# Patient Record
Sex: Female | Born: 1990
Health system: Southern US, Community
[De-identification: ages and names within clinical notes are randomized; demographics above are authoritative.]

## PROBLEM LIST (undated history)

## (undated) ENCOUNTER — Emergency Department (HOSPITAL_COMMUNITY): Payer: Medicaid Other

## (undated) DIAGNOSIS — O2230 Deep phlebothrombosis in pregnancy, unspecified trimester: Secondary | ICD-10-CM

## (undated) DIAGNOSIS — L709 Acne, unspecified: Secondary | ICD-10-CM

## (undated) DIAGNOSIS — O24419 Gestational diabetes mellitus in pregnancy, unspecified control: Secondary | ICD-10-CM

## (undated) HISTORY — DX: Gestational diabetes mellitus in pregnancy, unspecified control: O24.419

## (undated) HISTORY — DX: Acne, unspecified: L70.9

## (undated) HISTORY — DX: Deep phlebothrombosis in pregnancy, unspecified trimester: O22.30

## (undated) HISTORY — PX: DILATION AND CURETTAGE OF UTERUS: SHX78

---
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
## 2018-01-30 ENCOUNTER — Encounter (HOSPITAL_COMMUNITY): Payer: Self-pay | Admitting: Emergency Medicine

## 2018-01-30 ENCOUNTER — Emergency Department (HOSPITAL_COMMUNITY)
Admission: EM | Admit: 2018-01-30 | Discharge: 2018-01-30 | Disposition: A | Discharge status: Home / Self Care | Payer: Medicaid Other | Source: Home / Self Care | Attending: Emergency Medicine | Admitting: Emergency Medicine

## 2018-01-30 ENCOUNTER — Encounter (HOSPITAL_COMMUNITY): Payer: Self-pay | Admitting: Nurse Practitioner

## 2018-01-30 ENCOUNTER — Emergency Department (HOSPITAL_COMMUNITY)
Admission: EM | Admit: 2018-01-30 | Discharge: 2018-01-30 | Discharge status: Against Medical Advice | Payer: Medicaid Other | Attending: Emergency Medicine | Admitting: Emergency Medicine

## 2018-01-30 ENCOUNTER — Emergency Department (HOSPITAL_BASED_OUTPATIENT_CLINIC_OR_DEPARTMENT_OTHER): Payer: Medicaid Other

## 2018-01-30 DIAGNOSIS — Z5321 Procedure and treatment not carried out due to patient leaving prior to being seen by health care provider: Secondary | ICD-10-CM | POA: Diagnosis not present

## 2018-01-30 DIAGNOSIS — I82432 Acute embolism and thrombosis of left popliteal vein: Secondary | ICD-10-CM

## 2018-01-30 DIAGNOSIS — Z3201 Encounter for pregnancy test, result positive: Secondary | ICD-10-CM

## 2018-01-30 DIAGNOSIS — O2230 Deep phlebothrombosis in pregnancy, unspecified trimester: Secondary | ICD-10-CM

## 2018-01-30 DIAGNOSIS — I82452 Acute embolism and thrombosis of left peroneal vein: Secondary | ICD-10-CM | POA: Insufficient documentation

## 2018-01-30 DIAGNOSIS — I82442 Acute embolism and thrombosis of left tibial vein: Secondary | ICD-10-CM

## 2018-01-30 DIAGNOSIS — I82492 Acute embolism and thrombosis of other specified deep vein of left lower extremity: Secondary | ICD-10-CM | POA: Insufficient documentation

## 2018-01-30 DIAGNOSIS — Z3A08 8 weeks gestation of pregnancy: Secondary | ICD-10-CM | POA: Insufficient documentation

## 2018-01-30 DIAGNOSIS — M7989 Other specified soft tissue disorders: Secondary | ICD-10-CM | POA: Diagnosis not present

## 2018-01-30 DIAGNOSIS — R609 Edema, unspecified: Secondary | ICD-10-CM | POA: Diagnosis not present

## 2018-01-30 DIAGNOSIS — R52 Pain, unspecified: Secondary | ICD-10-CM

## 2018-01-30 DIAGNOSIS — I82409 Acute embolism and thrombosis of unspecified deep veins of unspecified lower extremity: Secondary | ICD-10-CM | POA: Diagnosis not present

## 2018-01-30 DIAGNOSIS — O2231 Deep phlebothrombosis in pregnancy, first trimester: Secondary | ICD-10-CM | POA: Diagnosis not present

## 2018-01-30 DIAGNOSIS — M79605 Pain in left leg: Secondary | ICD-10-CM | POA: Insufficient documentation

## 2018-01-30 LAB — BASIC METABOLIC PANEL
Anion gap: 8 (ref 5–15)
BUN: 13 mg/dL (ref 6–20)
CO2: 23 mmol/L (ref 22–32)
Calcium: 9 mg/dL (ref 8.9–10.3)
Chloride: 103 mmol/L (ref 98–111)
Creatinine, Ser: 0.6 mg/dL (ref 0.44–1.00)
GFR calc non Af Amer: >60 mL/min (ref >60)
Glucose, Bld: 85 mg/dL (ref 70–99)
Potassium: 3.9 mmol/L (ref 3.5–5.1)
Sodium: 134 mmol/L — ABNORMAL LOW (ref 135–145)

## 2018-01-30 LAB — URINALYSIS, ROUTINE W REFLEX MICROSCOPIC
BILIRUBIN URINE: NEGATIVE
Glucose, UA: NEGATIVE mg/dL
Hgb urine dipstick: NEGATIVE
Ketones, ur: NEGATIVE mg/dL
Nitrite: NEGATIVE
Protein, ur: NEGATIVE mg/dL
Specific Gravity, Urine: 1.03 (ref 1.005–1.030)
pH: 6 (ref 5.0–8.0)

## 2018-01-30 LAB — CBC WITH DIFFERENTIAL/PLATELET
Abs Immature Granulocytes: 0.05 10*3/uL (ref 0.00–0.07)
Basophils Absolute: 0 10*3/uL (ref 0.0–0.1)
Basophils Relative: 0 %
Eosinophils Absolute: 0.1 10*3/uL (ref 0.0–0.5)
Eosinophils Relative: 1 %
HEMATOCRIT: 36.2 % (ref 36.0–46.0)
Hemoglobin: 11.5 g/dL — ABNORMAL LOW (ref 12.0–15.0)
Immature Granulocytes: 1 %
Lymphocytes Relative: 8 %
Lymphs Abs: 0.6 10*3/uL — ABNORMAL LOW (ref 0.7–4.0)
MCH: 27.4 pg (ref 26.0–34.0)
MCHC: 31.8 g/dL (ref 30.0–36.0)
MCV: 86.2 fL (ref 80.0–100.0)
Monocytes Absolute: 0.7 10*3/uL (ref 0.1–1.0)
Monocytes Relative: 10 %
Neutro Abs: 6.1 10*3/uL (ref 1.7–7.7)
Neutrophils Relative %: 80 %
Platelets: 132 10*3/uL — ABNORMAL LOW (ref 150–400)
RBC: 4.2 MIL/uL (ref 3.87–5.11)
RDW: 13.6 % (ref 11.5–15.5)
WBC: 7.5 10*3/uL (ref 4.0–10.5)
nRBC: 0 % (ref 0.0–0.2)

## 2018-01-30 LAB — HEPATIC FUNCTION PANEL
ALK PHOS: 47 U/L (ref 38–126)
ALT: 27 U/L (ref 0–44)
AST: 27 U/L (ref 15–41)
Albumin: 3.6 g/dL (ref 3.5–5.0)
Bilirubin, Direct: <0.1 mg/dL (ref 0.0–0.2)
Total Bilirubin: 0.5 mg/dL (ref 0.3–1.2)
Total Protein: 7.8 g/dL (ref 6.5–8.1)

## 2018-01-30 LAB — PREGNANCY, URINE: Preg Test, Ur: POSITIVE — AB

## 2018-01-30 MED ORDER — ENOXAPARIN (LOVENOX) PATIENT EDUCATION KIT
PACK | Freq: Once | Status: AC
  Administered 2018-01-30: 16:00:00
  Filled 2018-01-30: qty 1

## 2018-01-30 MED ORDER — ACETAMINOPHEN 325 MG PO TABS
650.0000 mg | ORAL_TABLET | Freq: Once | ORAL | Status: AC
  Administered 2018-01-30: 650 mg via ORAL
  Filled 2018-01-30: qty 2

## 2018-01-30 MED ORDER — ENOXAPARIN SODIUM 80 MG/0.8ML ~~LOC~~ SOLN
1.0000 mg/kg | Freq: Once | SUBCUTANEOUS | Status: AC
  Administered 2018-01-30: 75 mg via SUBCUTANEOUS
  Filled 2018-01-30: qty 0.75

## 2018-01-30 MED ORDER — ENOXAPARIN SODIUM 40 MG/0.4ML ~~LOC~~ SOLN: 80.0000 mg | SUBCUTANEOUS | 0 refills | Status: DC

## 2018-01-30 NOTE — ED Notes (Signed)
Family at bedside. 

## 2018-01-30 NOTE — ED Provider Notes (Signed)
[facility] DEPT Provider Note   CSN: [id number] Arrival date & time: 01/30/18  1039     History   Chief Complaint Chief Complaint  Patient presents with  . Fever  . Leg Pain    HPI Marie Wade is a 27 y.o. female.  HPI  Marie Wade is a 27 y.o. female, with a history of G6P5A3, presenting to the ED with left lower leg pain beginning around December 4 and worsening thereafter.  Pain began after road trip to [place]. Pain is primarily in the left calf, but extends into the left ankle and behind the left knee.  Pain is described as sharp/tightness, currently 5/10.  Subjective swelling in the left calf. LMP Oct 17. Has had positive pregnancy tests at home. Is fairly new to the area and has not yet established with OBGYN or PCP locally. Patient also complains of subjective fever beginning yesterday along with nonproductive cough.  Triage note indicates patient has a history of blood clot in pregnancy, however, she clarifies she has no personal history of blood clots, but first-degree relatives do have history of blood clot in pregnancy.    Denies shortness of breath, chest pain, abdominal pain, urinary symptoms, vaginal bleeding, N/V/D, abnormal vaginal discharge, back/flank pain, syncope, hemoptysis, or any other complaints.    History reviewed. No pertinent past medical history.  There are no active problems to display for this patient.   History reviewed. No pertinent surgical history.   OB History    Gravida  1   Para      Term      Preterm      AB      Living        SAB      TAB      Ectopic      Multiple      Live Births               Home Medications    Prior to Admission medications   Medication Sig Start Date End Date Taking? Authorizing Provider  enoxaparin (LOVENOX) 40 MG/0.4ML injection Inject 0.8 mLs (80 mg total) into the skin daily for 10 days. 01/30/18 02/09/18  Lorayne Bender, PA-C    Family  History No family history on file.  Social History Social History   Tobacco Use  . Smoking status: Never Smoker  . Smokeless tobacco: Never Used  Substance Use Topics  . Alcohol use: Not on file  . Drug use: Not on file     Allergies   Watermelon [citrullus vulgaris]   Review of Systems Review of Systems  Constitutional: Positive for fever.  HENT: Negative for congestion.   Respiratory: Positive for cough. Negative for shortness of breath.   Cardiovascular: Negative for chest pain and palpitations.  Gastrointestinal: Negative for abdominal pain, diarrhea, nausea and vomiting.  Genitourinary: Negative for dysuria, flank pain, hematuria, vaginal bleeding and vaginal discharge.  Musculoskeletal: Negative for back pain and neck pain.  Skin: Negative for rash.  Neurological: Negative for weakness and headaches.  All other systems reviewed and are negative.    Physical Exam Updated Vital Signs BP 107/70 (BP Location: Left Arm)   Pulse 89   Temp 100.2 F (37.9 C) (Oral)   Resp 16   Ht '5\' 8"'  (1.727 m)   Wt 73.9 kg   LMP 11/18/2017   SpO2 99%   BMI 24.78 kg/m   Physical Exam Vitals signs and nursing note reviewed.  Constitutional:  General: She is not in acute distress.    Appearance: She is well-developed. She is not diaphoretic.  HENT:     Head: Normocephalic and atraumatic.  Eyes:     Conjunctiva/sclera: Conjunctivae normal.  Neck:     Musculoskeletal: Neck supple.  Cardiovascular:     Rate and Rhythm: Normal rate and regular rhythm.     Pulses:          Posterior tibial pulses are 2+ on the right side and 2+ on the left side.     Heart sounds: Normal heart sounds.  Pulmonary:     Effort: Pulmonary effort is normal. No respiratory distress.     Breath sounds: Normal breath sounds.  Abdominal:     Palpations: Abdomen is soft.     Tenderness: There is no abdominal tenderness. There is no guarding.  Musculoskeletal:     Comments: Patient is quite  tender through the left calf, but tenderness extends to the posterior left knee and into the left ankle.  Questionable increased warmth.  No noted color change or swelling. Range of motion intact in the left ankle and knee.  Lymphadenopathy:     Cervical: No cervical adenopathy.  Skin:    General: Skin is warm and dry.  Neurological:     Mental Status: She is alert.     Comments: Sensation grossly intact to light touch in the lower extremities bilaterally. No saddle anesthesias. Strength 5/5 with flexion and extension at the bilateral hips, knees, and ankles. Ambulatory without assistance. Coordination intact with heel to shin testing.  Psychiatric:        Behavior: Behavior normal.      ED Treatments / Results  Labs (all labs ordered are listed, but only abnormal results are displayed) Labs Reviewed  URINALYSIS, ROUTINE W REFLEX MICROSCOPIC - Abnormal; Notable for the following components:      Result Value   APPearance CLOUDY (*)    Leukocytes, UA SMALL (*)    Bacteria, UA RARE (*)    All other components within normal limits  BASIC METABOLIC PANEL - Abnormal; Notable for the following components:   Sodium 134 (*)    All other components within normal limits  CBC WITH DIFFERENTIAL/PLATELET - Abnormal; Notable for the following components:   Hemoglobin 11.5 (*)    Platelets 132 (*)    Lymphs Abs 0.6 (*)    All other components within normal limits  PREGNANCY, URINE - Abnormal; Notable for the following components:   Preg Test, Ur POSITIVE (*)    All other components within normal limits  HEPATIC FUNCTION PANEL    EKG None  Radiology Vas [place] Lower Extremity Venous (dvt) (mc And Wl 7a-7p)  Result Date: 01/30/2018  Lower Venous Study Indications: Pain, Swelling, and Edema. Other Indications: Current with 8 weeks pregnancy. Limitations: Body habitus and pain. Performing Technologist: Lorina Rabon  Examination Guidelines: A complete evaluation includes B-mode imaging,  spectral Doppler, color Doppler, and power Doppler as needed of all accessible portions of each vessel. Bilateral testing is considered an integral part of a complete examination. Limited examinations for reoccurring indications may be performed as noted.  Right Venous Findings: +---+---------------+---------+-----------+----------+-------+    CompressibilityPhasicitySpontaneityPropertiesSummary +---+---------------+---------+-----------+----------+-------+ CFVFull           Yes      Yes                          +---+---------------+---------+-----------+----------+-------+  Left Venous Findings: +---------+---------------+---------+-----------+----------+-------+  CompressibilityPhasicitySpontaneityPropertiesSummary +---------+---------------+---------+-----------+----------+-------+ CFV      Full           Yes      Yes                          +---------+---------------+---------+-----------+----------+-------+ SFJ      Full                                                 +---------+---------------+---------+-----------+----------+-------+ FV Prox  Full                                                 +---------+---------------+---------+-----------+----------+-------+ FV Mid   Full                                                 +---------+---------------+---------+-----------+----------+-------+ FV DistalFull                                                 +---------+---------------+---------+-----------+----------+-------+ PFV      Full                                                 +---------+---------------+---------+-----------+----------+-------+ POP      None           No       No                   Acute   +---------+---------------+---------+-----------+----------+-------+ PTV      None                                         Acute   +---------+---------------+---------+-----------+----------+-------+ PERO     None                                          Acute   +---------+---------------+---------+-----------+----------+-------+ Soleal   None                                         Acute   +---------+---------------+---------+-----------+----------+-------+ Gastroc  None                                         Acute   +---------+---------------+---------+-----------+----------+-------+    Summary: Right: No evidence of common femoral vein obstruction. Left: Findings consistent with acute deep vein thrombosis involving the left popliteal vein, left posterior tibial vein, left peroneal vein, left soleal  vein, and left gastrocnemius vein. No cystic structure found in the popliteal fossa.  *See table(s) above for measurements and observations.    Preliminary     Procedures Procedures (including critical care time)  Medications Ordered in ED Medications  acetaminophen (TYLENOL) tablet 650 mg (650 mg Oral Given 01/30/18 1359)  enoxaparin (LOVENOX) injection 75 mg (75 mg Subcutaneous Given 01/30/18 1556)  enoxaparin (LOVENOX) patient education kit ( Does not apply Given 01/30/18 1600)     Initial Impression / Assessment and Plan / ED Course  I have reviewed the triage vital signs and the nursing notes.  Pertinent labs & imaging results that were available during my care of the patient were reviewed by me and considered in my medical decision making (see chart for details).  Clinical Course as of Jan 31 1720  Sun Jan 30, 2018  1447 Spoke with Chilton Greathouse, answering phone for on call OBGYN who is in the middle of a c-section. She will have the physician call me back.   [SJ]  1500 Spoke with Larkin Ina, pharmacist. Confirms Lovenox is therapy of choice for pregnant patient with DVT.  He will place the necessary orders for the education kit and the first dose of Lovenox here in the ED.  RN to perform Lovenox administration counseling.   [SJ]  1557 Spoke with Dr. Rosana Hoes, faculty OB/GYN on  call. Agrees with plan for Lovenox as well as Lovenox dose of 1 mg/kg.  States she will contact to the scheduling personnel at her office and patient should expect a call tomorrow to set up an appointment to be seen early this week.   [SJ]    Clinical Course User Index [SJ] Shainna Faux C, PA-C    Patient presents with left lower extremity pain.  Evidence of DVT on duplex ultrasound.  Suspicion for PE is quite low given the patient's lack of chest related symptoms.  Patient does have cough and subjective fever, however, she has no complaint of chest pain or shortness of breath, no abnormal lung sounds, no increased work of breathing or tachypnea.  I also suspect if the patient has developed pneumonia with less than 24 hours of symptoms the likelihood that it would show up on chest x-ray today is suspected to be low.  Lovenox was initiated.  Education was provided at the bedside by the RN.  I confirmed with the patient and her family at the bedside that they understand the importance of this medication as well as its administration. Dosing of 1 mg/kg (75 mg) was administered here in the ED.  Due to available outpatient formulations, dosing of 80 mg was utilized for ease of administration.   Patient will follow-up with OB/GYN this week.  Return precautions discussed.  Patient voices understanding of these instructions, accepts the plan, and is comfortable with discharge.  Findings and plan of care discussed with Gareth Morgan, MD. Dr. Billy Fischer personally evaluated and examined this patient.   Vitals:   01/30/18 1054 01/30/18 1055 01/30/18 1253 01/30/18 1704  BP: 114/80  107/70 108/66  Pulse: (!) 107  89 96  Resp: '17  16 16  ' Temp: 100.2 F (37.9 C)   99.2 F (37.3 C)  TempSrc: Oral   Oral  SpO2: 99%  99% 99%  Weight:  73.9 kg    Height:  '5\' 8"'  (1.727 m)       Final Clinical Impressions(s) / ED Diagnoses   Final diagnoses:  DVT (deep vein thrombosis) in pregnancy  ED  Discharge Orders         Ordered    enoxaparin (LOVENOX) 40 MG/0.4ML injection  Every 24 hours     01/30/18 1627           Lorayne Bender, PA-C 01/30/18 1722    Gareth Morgan, MD 01/31/18 [phone]

## 2018-01-30 NOTE — Discharge Instructions (Signed)
There was evidence of a blood clot in the lower left leg. We have initiated a blood thinner to prevent further clotting.  Pain management can be performed with Tylenol.  Follow-up with OB/GYN.  Their office should call you tomorrow.  Return to the ED for chest pain, shortness of breath, dizziness, passing out, bleeding, or any other major concerns.

## 2018-01-30 NOTE — ED Triage Notes (Signed)
Pt is c/o left leg/calf pain that has been ongoing for about 3 weeks but worsened tonight. States she has noticed swelling, denies trauma and is concerned that blood clot run in her family.

## 2018-01-30 NOTE — ED Notes (Signed)
Pt reports L leg pain x 25 days ago, denies injury.  She is ambulatory without difficulty.  She also endorses sore throat x 2 days, with fever which started yesterday.  She was here last night for same but the wait was too long so she LWBS.

## 2018-01-30 NOTE — ED Notes (Addendum)
Pt instructed on lovenox administration.  Pt and boyfriend verbalized understanding.

## 2018-01-30 NOTE — Progress Notes (Addendum)
Left lower extremity venous duplex exam completed.  Positive Findings consistent with acute deep vein thrombosis involving the left popliteal vein, left posterior tibial vein, left peroneal vein, left soleal vein, and left gastrocnemius vein.  Result paged ordering physician through ED secretory.  More details please see preliminary notes on CV PROC under chart review. Memory Heinrichs H Makiah Foye(RDMS RVT) 01/30/18 3:33 PM

## 2018-01-30 NOTE — ED Triage Notes (Signed)
Per [facility] pt from home c/o left leg pain for 6 weeks and fever for 2 days.  reports blood clot hx during pregnancies. Pt hasnt taken anything for fever or pains. Pt is [redacted] weeks pregnant.  Vitals 132/60, 112/60, 100HR, 16R, 98% CBG 98 temp 101.5.

## 2018-01-30 NOTE — ED Notes (Signed)
Pt stated she was going to go home to get checked by her primary physician.

## 2018-01-31 ENCOUNTER — Telehealth: Payer: Self-pay | Admitting: Obstetrics & Gynecology

## 2018-01-31 NOTE — Telephone Encounter (Signed)
Called patient and informed of upcoming appointment. °

## 2018-02-11 ENCOUNTER — Other Ambulatory Visit (HOSPITAL_COMMUNITY)
Admission: RE | Admit: 2018-02-11 | Discharge: 2018-02-11 | Disposition: A | Discharge status: Home / Self Care | Payer: Medicaid Other | Source: Ambulatory Visit | Attending: Obstetrics & Gynecology | Admitting: Obstetrics & Gynecology

## 2018-02-11 ENCOUNTER — Ambulatory Visit (INDEPENDENT_AMBULATORY_CARE_PROVIDER_SITE_OTHER): Payer: Medicaid Other | Admitting: Obstetrics & Gynecology

## 2018-02-11 ENCOUNTER — Encounter: Payer: Self-pay | Admitting: Obstetrics & Gynecology

## 2018-02-11 DIAGNOSIS — O2231 Deep phlebothrombosis in pregnancy, first trimester: Secondary | ICD-10-CM | POA: Diagnosis not present

## 2018-02-11 DIAGNOSIS — Z8632 Personal history of gestational diabetes: Secondary | ICD-10-CM

## 2018-02-11 DIAGNOSIS — O2230 Deep phlebothrombosis in pregnancy, unspecified trimester: Secondary | ICD-10-CM | POA: Insufficient documentation

## 2018-02-11 DIAGNOSIS — O0990 Supervision of high risk pregnancy, unspecified, unspecified trimester: Secondary | ICD-10-CM | POA: Insufficient documentation

## 2018-02-11 DIAGNOSIS — O09891 Supervision of other high risk pregnancies, first trimester: Secondary | ICD-10-CM | POA: Diagnosis not present

## 2018-02-11 DIAGNOSIS — O0991 Supervision of high risk pregnancy, unspecified, first trimester: Secondary | ICD-10-CM

## 2018-02-11 HISTORY — DX: Personal history of gestational diabetes: Z86.32

## 2018-02-11 MED ORDER — PRENATAL PLUS 27-1 MG PO TABS: 1.0000 | ORAL_TABLET | Freq: Every day | ORAL | 0 refills | Status: DC

## 2018-02-11 NOTE — Progress Notes (Signed)
  Subjective:    Marie Wade is being seen today for her first obstetrical visit.  This is not a planned pregnancy. She is at [redacted]w[redacted]d gestation. Her obstetrical history is significant for left lower extremity DVT and GDM with her first pregnancy. Relationship with FOB: significant other, living together. Patient does intend to breast feed. Pregnancy history fully reviewed.  Patient reports no complaints.  Review of Systems:   Review of Systems  Objective:     BP 115/74   Pulse 82   Wt 161 lb (73 kg)   LMP 11/18/2017 (Exact Date)   BMI 24.48 kg/m  Physical Exam  Exam Breathing, conversing, and ambulating normally Well nourished, well hydrated Black female, no apparent distress Heart- rrr Lungs- CTAB Abd- benign   Assessment:    Pregnancy: [id number] Patient Active Problem List   Diagnosis Date Noted  . DVT (deep vein thrombosis) in pregnancy 02/11/2018  . Supervision of high risk pregnancy, antepartum 02/11/2018  . History of gestational diabetes 02/11/2018       Plan:     Initial labs drawn. Prenatal vitamins. Problem list reviewed and updated. NIPS ordered Role of ultrasound in pregnancy discussed; fetal survey: ordered Follow up in 4 weeks. Continue lovenox BID HBA1C    Marie Wade 02/11/2018

## 2018-02-13 LAB — URINE CULTURE, OB REFLEX: Organism ID, Bacteria: NO GROWTH

## 2018-02-13 LAB — CULTURE, OB URINE

## 2018-02-14 ENCOUNTER — Encounter: Payer: Self-pay | Admitting: *Deleted

## 2018-02-15 LAB — CYTOLOGY - PAP
Chlamydia: NEGATIVE
Diagnosis: NEGATIVE
Neisseria Gonorrhea: NEGATIVE

## 2018-02-16 ENCOUNTER — Telehealth: Payer: Self-pay

## 2018-02-16 ENCOUNTER — Encounter: Payer: Medicaid Other | Admitting: Obstetrics

## 2018-02-16 MED ORDER — ENOXAPARIN SODIUM 40 MG/0.4ML ~~LOC~~ SOLN: 40.0000 mg | Freq: Two times a day (BID) | SUBCUTANEOUS | 4 refills | Status: DC

## 2018-02-16 NOTE — Telephone Encounter (Signed)
Pt called stating that she needs a refill on her blood thinner.  LM for pt that we have refilled the medication that she has requested to please go to her [facility] pharmacy on [address] to pick up.  MyChart message sent as well.

## 2018-02-24 LAB — INHERITEST(R) CF/SMA PANEL

## 2018-02-24 LAB — OBSTETRIC PANEL, INCLUDING HIV
Antibody Screen: NEGATIVE
BASOS ABS: 0 10*3/uL (ref 0.0–0.2)
Basos: 0 %
EOS (ABSOLUTE): 0.1 10*3/uL (ref 0.0–0.4)
Eos: 1 %
HEP B S AG: NEGATIVE
HIV Screen 4th Generation wRfx: NONREACTIVE
Hematocrit: 32.8 % — ABNORMAL LOW (ref 34.0–46.6)
Hemoglobin: 10.9 g/dL — ABNORMAL LOW (ref 11.1–15.9)
Immature Grans (Abs): 0.1 10*3/uL (ref 0.0–0.1)
Immature Granulocytes: 1 %
Lymphocytes Absolute: 1.7 10*3/uL (ref 0.7–3.1)
Lymphs: 23 %
MCH: 27.4 pg (ref 26.6–33.0)
MCHC: 33.2 g/dL (ref 31.5–35.7)
MCV: 82 fL (ref 79–97)
MONOCYTES: 11 %
Monocytes Absolute: 0.8 10*3/uL (ref 0.1–0.9)
Neutrophils Absolute: 4.8 10*3/uL (ref 1.4–7.0)
Neutrophils: 64 %
Platelets: 272 10*3/uL (ref 150–450)
RBC: 3.98 x10E6/uL (ref 3.77–5.28)
RDW: 13.7 % (ref 11.7–15.4)
RPR: NONREACTIVE
Rh Factor: POSITIVE
Rubella Antibodies, IGG: 2.19 index (ref >0.99)
WBC: 7.4 10*3/uL (ref 3.4–10.8)

## 2018-02-24 LAB — HEMOGLOBINOPATHY EVALUATION
Ferritin: 82 ng/mL (ref 15–150)
Hgb A2 Quant: 2.3 % (ref 1.8–3.2)
Hgb A: 97.7 % (ref 96.4–98.8)
Hgb C: 0 %
Hgb F Quant: 0 % (ref 0.0–2.0)
Hgb S: 0 %
Hgb Solubility: NEGATIVE
Hgb Variant: 0 %

## 2018-02-24 LAB — HEMOGLOBIN A1C
Est. average glucose Bld gHb Est-mCnc: 103 mg/dL
Hgb A1c MFr Bld: 5.2 % (ref 4.8–5.6)

## 2018-03-07 ENCOUNTER — Encounter: Payer: Self-pay | Admitting: *Deleted

## 2018-03-11 ENCOUNTER — Ambulatory Visit (INDEPENDENT_AMBULATORY_CARE_PROVIDER_SITE_OTHER): Payer: Medicaid Other | Admitting: Obstetrics & Gynecology

## 2018-03-11 VITALS — BP 117/70 | HR 83 | Wt 167.5 lb

## 2018-03-11 DIAGNOSIS — O2230 Deep phlebothrombosis in pregnancy, unspecified trimester: Secondary | ICD-10-CM

## 2018-03-11 DIAGNOSIS — O0990 Supervision of high risk pregnancy, unspecified, unspecified trimester: Secondary | ICD-10-CM | POA: Diagnosis not present

## 2018-03-11 DIAGNOSIS — Z8632 Personal history of gestational diabetes: Secondary | ICD-10-CM

## 2018-03-11 DIAGNOSIS — O0992 Supervision of high risk pregnancy, unspecified, second trimester: Secondary | ICD-10-CM

## 2018-03-11 DIAGNOSIS — O2232 Deep phlebothrombosis in pregnancy, second trimester: Secondary | ICD-10-CM

## 2018-03-11 DIAGNOSIS — O99019 Anemia complicating pregnancy, unspecified trimester: Secondary | ICD-10-CM

## 2018-03-11 DIAGNOSIS — O99012 Anemia complicating pregnancy, second trimester: Secondary | ICD-10-CM

## 2018-03-11 HISTORY — DX: Anemia complicating pregnancy, unspecified trimester: O99.019

## 2018-03-11 MED ORDER — FERROUS SULFATE 325 (65 FE) MG PO TABS: 325.0000 mg | ORAL_TABLET | Freq: Every day | ORAL | 3 refills | Status: DC

## 2018-03-11 MED ORDER — IBUPROFEN 800 MG PO TABS: 800.0000 mg | ORAL_TABLET | Freq: Three times a day (TID) | ORAL | 1 refills | Status: DC | PRN

## 2018-03-11 NOTE — Progress Notes (Signed)
   PRENATAL VISIT NOTE  Subjective:  Marie Wade is a 28 y.o. [id number] at [redacted]w[redacted]d being seen today for ongoing prenatal care.  She is currently monitored for the following issues for this high-risk pregnancy and has DVT (deep vein thrombosis) in pregnancy; Supervision of high risk pregnancy, antepartum; History of gestational diabetes; and Anemia in pregnancy on their problem list.  Patient reports headache.  Contractions: Not present. Vag. Bleeding: None.  Movement: Absent. Denies leaking of fluid.   The following portions of the patient's history were reviewed and updated as appropriate: allergies, current medications, past family history, past medical history, past social history, past surgical history and problem list. Problem list updated.  Objective:   Vitals:   03/11/18 0935  BP: 117/70  Pulse: 83  Weight: 167 lb 8 oz (76 kg)    Fetal Status: Fetal Heart Rate (bpm): 154   Movement: Absent     General:  Alert, oriented and cooperative. Patient is in no acute distress.  Skin: Skin is warm and dry. No rash noted.   Cardiovascular: Normal heart rate noted  Respiratory: Normal respiratory effort, no problems with respiration noted  Abdomen: Soft, gravid, appropriate for gestational age.  Pain/Pressure: Absent     Pelvic: Cervical exam deferred        Extremities: Normal range of motion.  Edema: Trace  Mental Status: Normal mood and affect. Normal behavior. Normal judgment and thought content.   Assessment and Plan:  Pregnancy: [id number] at [redacted]w[redacted]d  1. History of gestational diabetes - normal HBA1C  2. Supervision of high risk pregnancy, antepartum - MSAFP today  3. DVT (deep vein thrombosis) in pregnancy - on Lovenox  4. Anemia during pregnancy in second trimester - iron daily  5. Headaches- I prescribed IBU 800mg  q 8 hours Rec stop IBU at 28 weeks  Preterm labor symptoms and general obstetric precautions including but not limited to vaginal bleeding, contractions,  leaking of fluid and fetal movement were reviewed in detail with the patient. Please refer to After Visit Summary for other counseling recommendations.  No follow-ups on file.  No future appointments.  Allie Bossier, MD

## 2018-03-11 NOTE — Progress Notes (Signed)
Headaches 650 mg tylenol only one with no relief.  Lumps with lovanox

## 2018-03-13 LAB — AFP, SERUM, OPEN SPINA BIFIDA
AFP MOM: 0.81
AFP Value: 28 ng/mL
Gest. Age on Collection Date: 16.1 weeks
MATERNAL AGE AT EDD: 28.1 a
OSBR Risk 1 IN: 10000
Test Results:: NEGATIVE
Weight: 167 [lb_av]

## 2018-03-14 ENCOUNTER — Other Ambulatory Visit: Payer: Self-pay

## 2018-03-14 DIAGNOSIS — O0990 Supervision of high risk pregnancy, unspecified, unspecified trimester: Secondary | ICD-10-CM

## 2018-03-14 MED ORDER — PRENATAL PLUS 27-1 MG PO TABS: 1.0000 | ORAL_TABLET | Freq: Every day | ORAL | 11 refills | Status: DC

## 2018-04-06 ENCOUNTER — Ambulatory Visit (HOSPITAL_COMMUNITY)
Admission: RE | Admit: 2018-04-06 | Discharge: 2018-04-06 | Disposition: A | Discharge status: Home / Self Care | Payer: Medicaid Other | Source: Ambulatory Visit | Attending: Obstetrics & Gynecology | Admitting: Obstetrics & Gynecology

## 2018-04-06 ENCOUNTER — Other Ambulatory Visit (HOSPITAL_COMMUNITY): Payer: Self-pay | Admitting: *Deleted

## 2018-04-06 ENCOUNTER — Ambulatory Visit (HOSPITAL_COMMUNITY): Payer: Medicaid Other | Admitting: *Deleted

## 2018-04-06 ENCOUNTER — Encounter (HOSPITAL_COMMUNITY): Payer: Self-pay

## 2018-04-06 VITALS — BP 119/71 | HR 73 | Wt 173.6 lb

## 2018-04-06 DIAGNOSIS — O2232 Deep phlebothrombosis in pregnancy, second trimester: Secondary | ICD-10-CM | POA: Diagnosis not present

## 2018-04-06 DIAGNOSIS — Z3A19 19 weeks gestation of pregnancy: Secondary | ICD-10-CM

## 2018-04-06 DIAGNOSIS — Z362 Encounter for other antenatal screening follow-up: Secondary | ICD-10-CM

## 2018-04-06 DIAGNOSIS — O0990 Supervision of high risk pregnancy, unspecified, unspecified trimester: Secondary | ICD-10-CM

## 2018-04-06 DIAGNOSIS — O09292 Supervision of pregnancy with other poor reproductive or obstetric history, second trimester: Secondary | ICD-10-CM

## 2018-04-06 DIAGNOSIS — O2230 Deep phlebothrombosis in pregnancy, unspecified trimester: Secondary | ICD-10-CM | POA: Insufficient documentation

## 2018-04-06 DIAGNOSIS — Z8632 Personal history of gestational diabetes: Secondary | ICD-10-CM | POA: Diagnosis not present

## 2018-04-06 DIAGNOSIS — Z363 Encounter for antenatal screening for malformations: Secondary | ICD-10-CM | POA: Diagnosis not present

## 2018-04-08 ENCOUNTER — Ambulatory Visit (INDEPENDENT_AMBULATORY_CARE_PROVIDER_SITE_OTHER): Payer: Medicaid Other | Admitting: Obstetrics & Gynecology

## 2018-04-08 VITALS — BP 106/71 | HR 81 | Wt 174.0 lb

## 2018-04-08 DIAGNOSIS — O0990 Supervision of high risk pregnancy, unspecified, unspecified trimester: Secondary | ICD-10-CM

## 2018-04-08 DIAGNOSIS — O2230 Deep phlebothrombosis in pregnancy, unspecified trimester: Secondary | ICD-10-CM

## 2018-04-08 DIAGNOSIS — Z8632 Personal history of gestational diabetes: Secondary | ICD-10-CM

## 2018-04-08 DIAGNOSIS — Z3A20 20 weeks gestation of pregnancy: Secondary | ICD-10-CM

## 2018-04-08 DIAGNOSIS — O99012 Anemia complicating pregnancy, second trimester: Secondary | ICD-10-CM

## 2018-04-10 LAB — AFP, SERUM, OPEN SPINA BIFIDA
AFP MoM: 0.71
AFP Value: 40.8 ng/mL
Gest. Age on Collection Date: 20.1 weeks
MATERNAL AGE AT EDD: 28.1 a
OSBR Risk 1 IN: 10000
Test Results:: NEGATIVE
Weight: 174 [lb_av]

## 2018-04-26 ENCOUNTER — Encounter: Payer: Self-pay | Admitting: *Deleted

## 2018-05-02 ENCOUNTER — Telehealth: Payer: Self-pay | Admitting: Family Medicine

## 2018-05-02 NOTE — Telephone Encounter (Signed)
Called and spoke to patient letting her know that her appt will be postponed due to COVID 19, patient will be scheduled at [redacted] weeks along  With Labs, patient understood

## 2018-05-04 ENCOUNTER — Ambulatory Visit (HOSPITAL_COMMUNITY): Payer: Medicaid Other

## 2018-05-06 ENCOUNTER — Encounter: Payer: Medicaid Other | Admitting: Obstetrics & Gynecology

## 2018-06-01 ENCOUNTER — Telehealth: Payer: Self-pay | Admitting: Obstetrics and Gynecology

## 2018-06-01 NOTE — Telephone Encounter (Signed)
Called the patient to inform of the upcoming appointment. Left a detailed voicemail. °

## 2018-06-02 ENCOUNTER — Other Ambulatory Visit: Payer: Medicaid Other

## 2018-06-02 ENCOUNTER — Other Ambulatory Visit: Payer: Self-pay

## 2018-06-02 ENCOUNTER — Ambulatory Visit (INDEPENDENT_AMBULATORY_CARE_PROVIDER_SITE_OTHER): Payer: Medicaid Other | Admitting: Obstetrics & Gynecology

## 2018-06-02 ENCOUNTER — Encounter: Payer: Medicaid Other | Admitting: Obstetrics & Gynecology

## 2018-06-02 ENCOUNTER — Other Ambulatory Visit: Payer: Self-pay | Admitting: General Practice

## 2018-06-02 VITALS — BP 108/68 | HR 77 | Temp 98.2°F | Wt 183.8 lb

## 2018-06-02 DIAGNOSIS — O0993 Supervision of high risk pregnancy, unspecified, third trimester: Secondary | ICD-10-CM | POA: Diagnosis not present

## 2018-06-02 DIAGNOSIS — O0990 Supervision of high risk pregnancy, unspecified, unspecified trimester: Secondary | ICD-10-CM | POA: Diagnosis not present

## 2018-06-02 DIAGNOSIS — Z3A28 28 weeks gestation of pregnancy: Secondary | ICD-10-CM | POA: Diagnosis not present

## 2018-06-02 DIAGNOSIS — Z23 Encounter for immunization: Secondary | ICD-10-CM

## 2018-06-02 NOTE — Progress Notes (Signed)
Pt registered for BRx but never rcvd BP Cuff, will give her one & educate on use.

## 2018-06-02 NOTE — Progress Notes (Signed)
   PRENATAL VISIT NOTE  Subjective:  Marie Wade is a 28 y.o. [id number] at [redacted]w[redacted]d being seen today for ongoing prenatal care.  She is currently monitored for the following issues for this high-risk pregnancy and has DVT (deep vein thrombosis) in pregnancy; Supervision of high risk pregnancy, antepartum; History of gestational diabetes; and Anemia in pregnancy on their problem list.  Patient reports no complaints.  Contractions: Not present. Vag. Bleeding: None.  Movement: Present. Denies leaking of fluid.   The following portions of the patient's history were reviewed and updated as appropriate: allergies, current medications, past family history, past medical history, past social history, past surgical history and problem list.   Objective:   Vitals:   06/02/18 0908  BP: 108/68  Pulse: 77  Temp: 98.2 F (36.8 C)  Weight: 183 lb 12.8 oz (83.4 kg)    Fetal Status: Fetal Heart Rate (bpm): 164 Fundal Height: 28 cm Movement: Present     General:  Alert, oriented and cooperative. Patient is in no acute distress.  Skin: Skin is warm and dry. No rash noted.   Cardiovascular: Normal heart rate noted  Respiratory: Normal respiratory effort, no problems with respiration noted  Abdomen: Soft, gravid, appropriate for gestational age.  Pain/Pressure: Absent     Pelvic: Cervical exam deferred        Extremities: Normal range of motion.  Edema: None  Mental Status: Normal mood and affect. Normal behavior. Normal judgment and thought content.   Assessment and Plan:  Pregnancy: [id number] at [redacted]w[redacted]d 1. Supervision of high risk pregnancy, antepartum Routine testing - Tdap vaccine greater than or equal to 7yo IM  Preterm labor symptoms and general obstetric precautions including but not limited to vaginal bleeding, contractions, leaking of fluid and fetal movement were reviewed in detail with the patient. Please refer to After Visit Summary for other counseling recommendations.   Return in about 4  weeks (around 06/30/2018) for babyscripts.  Future Appointments  Date Time Provider Department Center  06/06/2018  8:10 AM [facility] NURSE [facility]  06/06/2018  8:15 AM [facility] [place] 4 [facility]    Scheryl Darter, MD

## 2018-06-02 NOTE — Patient Instructions (Signed)

## 2018-06-03 LAB — CBC
Hematocrit: 32.9 % — ABNORMAL LOW (ref 34.0–46.6)
Hemoglobin: 10.8 g/dL — ABNORMAL LOW (ref 11.1–15.9)
MCH: 27.8 pg (ref 26.6–33.0)
MCHC: 32.8 g/dL (ref 31.5–35.7)
MCV: 85 fL (ref 79–97)
Platelets: 180 10*3/uL (ref 150–450)
RBC: 3.89 x10E6/uL (ref 3.77–5.28)
RDW: 12.9 % (ref 11.7–15.4)
WBC: 5.9 10*3/uL (ref 3.4–10.8)

## 2018-06-03 LAB — RPR: RPR Ser Ql: NONREACTIVE

## 2018-06-03 LAB — GLUCOSE TOLERANCE, 2 HOURS W/ 1HR
Glucose, 1 hour: 127 mg/dL (ref 65–179)
Glucose, 2 hour: 125 mg/dL (ref 65–152)
Glucose, Fasting: 78 mg/dL (ref 65–91)

## 2018-06-03 LAB — HIV ANTIBODY (ROUTINE TESTING W REFLEX): HIV Screen 4th Generation wRfx: NONREACTIVE

## 2018-06-06 ENCOUNTER — Other Ambulatory Visit: Payer: Self-pay

## 2018-06-06 ENCOUNTER — Encounter (HOSPITAL_COMMUNITY): Payer: Self-pay

## 2018-06-06 ENCOUNTER — Ambulatory Visit (HOSPITAL_COMMUNITY): Payer: Medicaid Other | Admitting: *Deleted

## 2018-06-06 ENCOUNTER — Ambulatory Visit (HOSPITAL_COMMUNITY)
Admission: RE | Admit: 2018-06-06 | Discharge: 2018-06-06 | Disposition: A | Discharge status: Home / Self Care | Payer: Medicaid Other | Source: Ambulatory Visit | Attending: Obstetrics and Gynecology | Admitting: Obstetrics and Gynecology

## 2018-06-06 VITALS — Temp 98.6°F

## 2018-06-06 DIAGNOSIS — Z3A28 28 weeks gestation of pregnancy: Secondary | ICD-10-CM | POA: Diagnosis not present

## 2018-06-06 DIAGNOSIS — O0990 Supervision of high risk pregnancy, unspecified, unspecified trimester: Secondary | ICD-10-CM | POA: Insufficient documentation

## 2018-06-06 DIAGNOSIS — O09293 Supervision of pregnancy with other poor reproductive or obstetric history, third trimester: Secondary | ICD-10-CM

## 2018-06-06 DIAGNOSIS — Z362 Encounter for other antenatal screening follow-up: Secondary | ICD-10-CM | POA: Diagnosis not present

## 2018-06-06 DIAGNOSIS — O2233 Deep phlebothrombosis in pregnancy, third trimester: Secondary | ICD-10-CM | POA: Diagnosis not present

## 2018-06-30 ENCOUNTER — Ambulatory Visit (INDEPENDENT_AMBULATORY_CARE_PROVIDER_SITE_OTHER): Payer: Medicaid Other | Admitting: Obstetrics and Gynecology

## 2018-06-30 ENCOUNTER — Encounter: Payer: Self-pay | Admitting: Obstetrics and Gynecology

## 2018-06-30 ENCOUNTER — Other Ambulatory Visit: Payer: Self-pay

## 2018-06-30 DIAGNOSIS — O2230 Deep phlebothrombosis in pregnancy, unspecified trimester: Secondary | ICD-10-CM

## 2018-06-30 DIAGNOSIS — Z3A32 32 weeks gestation of pregnancy: Secondary | ICD-10-CM

## 2018-06-30 DIAGNOSIS — Z8632 Personal history of gestational diabetes: Secondary | ICD-10-CM

## 2018-06-30 DIAGNOSIS — O99013 Anemia complicating pregnancy, third trimester: Secondary | ICD-10-CM

## 2018-06-30 DIAGNOSIS — O2233 Deep phlebothrombosis in pregnancy, third trimester: Secondary | ICD-10-CM

## 2018-06-30 DIAGNOSIS — O0990 Supervision of high risk pregnancy, unspecified, unspecified trimester: Secondary | ICD-10-CM

## 2018-06-30 NOTE — Progress Notes (Signed)
Pt needs refill on Lovenox and iron. Pt states she has not been checking her blood pressure and is unable to check at this time.

## 2018-06-30 NOTE — Progress Notes (Signed)
   TELEHEALTH OBSTETRICS PRENATAL VIRTUAL VIDEO VISIT ENCOUNTER NOTE  Provider location: [facility] for [employer] [facility]   I connected with [name] on 06/30/18 at 10:15 AM EDT by MyChart Video Encounter at home and verified that I am speaking with the correct person using two identifiers.   I discussed the limitations, risks, security and privacy concerns of performing an evaluation and management service by telephone and the availability of in person appointments. I also discussed with the patient that there may be a patient responsible charge related to this service. The patient expressed understanding and agreed to proceed. Subjective:  Marie Wade is a 28 y.o. [id number] at [redacted]w[redacted]d being seen today for ongoing prenatal care.  She is currently monitored for the following issues for this high-risk pregnancy and has DVT (deep vein thrombosis) in pregnancy; Supervision of high risk pregnancy, antepartum; History of gestational diabetes; and Anemia in pregnancy on their problem list.  Patient reports no complaints.  Contractions: Not present. Vag. Bleeding: None.  Movement: Present. Denies any leaking of fluid.   The following portions of the patient's history were reviewed and updated as appropriate: allergies, current medications, past family history, past medical history, past social history, past surgical history and problem list.   Objective:  There were no vitals filed for this visit.  Fetal Status:     Movement: Present     General:  Alert, oriented and cooperative. Patient is in no acute distress.  Respiratory: Normal respiratory effort, no problems with respiration noted  Mental Status: Normal mood and affect. Normal behavior. Normal judgment and thought content.  Rest of physical exam deferred due to type of encounter   Assessment and Plan:  Pregnancy: G2R4270 at [redacted]w[redacted]d  1. Supervision of high risk pregnancy, antepartum Has BP cuff at home, will take BP when she  gets home  2. DVT (deep vein thrombosis) in pregnancy Cont lovenox 40 mg BID  3. History of gestational diabetes 2 hr GTT wnl  4. Anemia during pregnancy in third trimester   Preterm labor symptoms and general obstetric precautions including but not limited to vaginal bleeding, contractions, leaking of fluid and fetal movement were reviewed in detail with the patient. I discussed the assessment and treatment plan with the patient. The patient was provided an opportunity to ask questions and all were answered. The patient agreed with the plan and demonstrated an understanding of the instructions. The patient was advised to call back or seek an in-person office evaluation/go to [facility] at [facility] for any urgent or concerning symptoms. Please refer to After Visit Summary for other counseling recommendations.   I provided 15 minutes of face-to-face time during this encounter.  Return in about 2 weeks (around 07/14/2018) for OB visit (MD), virtual.  No future appointments.  Conan Bowens, MD Center for [facility], [facility]

## 2018-07-12 ENCOUNTER — Telehealth: Payer: Self-pay | Admitting: Family Medicine

## 2018-07-12 NOTE — Telephone Encounter (Signed)
Left patient a detailed message about her mychart Virtual visit on 6-10.

## 2018-07-13 ENCOUNTER — Telehealth: Payer: Self-pay | Admitting: Obstetrics and Gynecology

## 2018-07-13 ENCOUNTER — Telehealth (INDEPENDENT_AMBULATORY_CARE_PROVIDER_SITE_OTHER): Payer: Medicaid Other | Admitting: Obstetrics and Gynecology

## 2018-07-13 ENCOUNTER — Encounter: Payer: Self-pay | Admitting: Obstetrics and Gynecology

## 2018-07-13 VITALS — BP 111/72 | HR 80

## 2018-07-13 DIAGNOSIS — O99013 Anemia complicating pregnancy, third trimester: Secondary | ICD-10-CM

## 2018-07-13 DIAGNOSIS — O0990 Supervision of high risk pregnancy, unspecified, unspecified trimester: Secondary | ICD-10-CM

## 2018-07-13 DIAGNOSIS — O2233 Deep phlebothrombosis in pregnancy, third trimester: Secondary | ICD-10-CM

## 2018-07-13 DIAGNOSIS — Z8632 Personal history of gestational diabetes: Secondary | ICD-10-CM

## 2018-07-13 DIAGNOSIS — O2230 Deep phlebothrombosis in pregnancy, unspecified trimester: Secondary | ICD-10-CM

## 2018-07-13 DIAGNOSIS — Z3A33 33 weeks gestation of pregnancy: Secondary | ICD-10-CM

## 2018-07-13 NOTE — Telephone Encounter (Signed)
Attempted to call patient with her next OB appointment 6/25 @ 3:55 at our office. No answer, left a detailed message where the patient was instructed to wear a face mask to her appointment and no visitors will be allowed for her appointment. Patient also instructed that if she has any symptoms before her appointment that she give the office a call and not attend her appointment. List of symptoms were left as well as office number.

## 2018-07-13 NOTE — Progress Notes (Signed)
   TELEHEALTH OBSTETRICS PRENATAL VIRTUAL VIDEO VISIT ENCOUNTER NOTE  Provider location: Center for [employer] at [facility]   I connected with Marie Wade on 07/13/18 at 11:15 AM EDT by MyChart Video Encounter at home and verified that I am speaking with the correct person using two identifiers.   I discussed the limitations, risks, security and privacy concerns of performing an evaluation and management service by telephone and the availability of in person appointments. I also discussed with the patient that there may be a patient responsible charge related to this service. The patient expressed understanding and agreed to proceed. Subjective:  Marie Wade is a 28 y.o. [id number] at [redacted]w[redacted]d being seen today for ongoing prenatal care.  She is currently monitored for the following issues for this high-risk pregnancy and has DVT (deep vein thrombosis) in pregnancy; Supervision of high risk pregnancy, antepartum; History of gestational diabetes; and Anemia in pregnancy on their problem list.  Patient reports no complaints.  Contractions: Irritability. Vag. Bleeding: None.  Movement: Present. Denies any leaking of fluid.   The following portions of the patient's history were reviewed and updated as appropriate: allergies, current medications, past family history, past medical history, past social history, past surgical history and problem list.   Objective:   Vitals:   07/13/18 1138  BP: 111/72  Pulse: 80    Fetal Status:     Movement: Present     General:  Alert, oriented and cooperative. Patient is in no acute distress.  Respiratory: Normal respiratory effort, no problems with respiration noted  Mental Status: Normal mood and affect. Normal behavior. Normal judgment and thought content.  Rest of physical exam deferred due to type of encounter  Imaging: No results found.  Assessment and Plan:  Pregnancy: [id number] at [redacted]w[redacted]d  1. Supervision of high risk pregnancy, antepartum  2. DVT (deep vein thrombosis) in pregnancy Cont lovenox  3. History of gestational diabetes  4. Anemia during pregnancy in third trimester  Preterm labor symptoms and general obstetric precautions including but not limited to vaginal bleeding, contractions, leaking of fluid and fetal movement were reviewed in detail with the patient. I discussed the assessment and treatment plan with the patient. The patient was provided an opportunity to ask questions and all were answered. The patient agreed with the plan and demonstrated an understanding of the instructions. The patient was advised to call back or seek an in-person office evaluation/go to MAU at [facility] for any urgent or concerning symptoms. Please refer to After Visit Summary for other counseling recommendations.   I provided 15 minutes of face-to-face time during this encounter.  Return in about 2 weeks (around 07/27/2018) for OB visit (MD), in person swabs.  No future appointments.  Sloan Leiter, MD Center for [facility], [place]

## 2018-07-22 ENCOUNTER — Telehealth: Payer: Self-pay | Admitting: Family Medicine

## 2018-07-22 NOTE — Telephone Encounter (Signed)
Attempted to call patient to informer her that her FMLA paperwork has been completed and ready for pick-up. No answer, left voicemail with the above information. Patient also informed that the $15 payment will be due when she comes in to complete the paperwork.

## 2018-07-26 ENCOUNTER — Telehealth: Payer: Self-pay | Admitting: *Deleted

## 2018-07-26 NOTE — Telephone Encounter (Signed)
Called pt regarding babyscripts.  Per review, pt had never logged a BP into the app and her app was changed to optimization on 6/18.  Per chart review, pt has a BP cuff at home.  Pt did not pick up.  Left voicemail advising pt that she was being contacted regarding babyscripts and requesting that she contact the clinic.  Mychart message sent.

## 2018-07-28 ENCOUNTER — Other Ambulatory Visit (HOSPITAL_COMMUNITY)
Admission: RE | Admit: 2018-07-28 | Discharge: 2018-07-28 | Disposition: A | Discharge status: Home / Self Care | Payer: Medicaid Other | Source: Ambulatory Visit | Attending: Family Medicine | Admitting: Family Medicine

## 2018-07-28 ENCOUNTER — Ambulatory Visit (INDEPENDENT_AMBULATORY_CARE_PROVIDER_SITE_OTHER): Payer: Medicaid Other | Admitting: Family Medicine

## 2018-07-28 ENCOUNTER — Other Ambulatory Visit: Payer: Self-pay

## 2018-07-28 VITALS — BP 112/73 | HR 89 | Temp 98.4°F | Wt 190.5 lb

## 2018-07-28 DIAGNOSIS — O0990 Supervision of high risk pregnancy, unspecified, unspecified trimester: Secondary | ICD-10-CM | POA: Insufficient documentation

## 2018-07-28 DIAGNOSIS — O0993 Supervision of high risk pregnancy, unspecified, third trimester: Secondary | ICD-10-CM

## 2018-07-28 DIAGNOSIS — O2233 Deep phlebothrombosis in pregnancy, third trimester: Secondary | ICD-10-CM

## 2018-07-28 DIAGNOSIS — Z3A36 36 weeks gestation of pregnancy: Secondary | ICD-10-CM

## 2018-07-28 DIAGNOSIS — O2230 Deep phlebothrombosis in pregnancy, unspecified trimester: Secondary | ICD-10-CM

## 2018-07-28 LAB — OB RESULTS CONSOLE GBS: GBS: NEGATIVE

## 2018-07-28 NOTE — Patient Instructions (Signed)

## 2018-07-28 NOTE — Progress Notes (Signed)
   PRENATAL VISIT NOTE  Subjective:  Marie Wade is a 28 y.o. [id number] at [redacted]w[redacted]d being seen today for ongoing prenatal care.  She is currently monitored for the following issues for this high-risk pregnancy and has DVT (deep vein thrombosis) in pregnancy; Supervision of high risk pregnancy, antepartum; History of gestational diabetes; and Anemia in pregnancy on their problem list.  Patient reports no complaints.  Contractions: Irritability. Vag. Bleeding: None.  Movement: Present. Denies leaking of fluid.   The following portions of the patient's history were reviewed and updated as appropriate: allergies, current medications, past family history, past medical history, past social history, past surgical history and problem list.   Objective:   Vitals:   07/28/18 1614  BP: 112/73  Pulse: 89  Temp: 98.4 F (36.9 C)  Weight: 190 lb 8 oz (86.4 kg)    Fetal Status: Fetal Heart Rate (bpm): 146 Fundal Height: 36 cm Movement: Present  Presentation: Vertex  General:  Alert, oriented and cooperative. Patient is in no acute distress.  Skin: Skin is warm and dry. No rash noted.   Cardiovascular: Normal heart rate noted  Respiratory: Normal respiratory effort, no problems with respiration noted  Abdomen: Soft, gravid, appropriate for gestational age.  Pain/Pressure: Present     Pelvic: Cervical exam performed Dilation: 1 Effacement (%): Thick Station: Ballotable  Extremities: Normal range of motion.  Edema: Trace  Mental Status: Normal mood and affect. Normal behavior. Normal judgment and thought content.   Assessment and Plan:  Pregnancy: [id number] at [redacted]w[redacted]d 1. Supervision of high risk pregnancy, antepartum Cultures today Not on BabyRx, but can check BP and will report at next visitual visit - GC/Chlamydia probe amp ([place])not at [facility] - Culture, beta strep (group b only)  2. DVT (deep vein thrombosis) in pregnancy Continue Lovenox  Term labor symptoms and general obstetric  precautions including but not limited to vaginal bleeding, contractions, leaking of fluid and fetal movement were reviewed in detail with the patient. Please refer to After Visit Summary for other counseling recommendations.   Return in 1 week (on 08/04/2018) for virtual, [name].  Future Appointments  Date Time Provider [place]  08/04/2018 10:15 AM Aletha Halim, MD [facility] W[facility]  08/11/2018  8:15 AM Nicolette Bang, DO WOC-WOCA [place]  08/18/2018  8:15 AM Emily Filbert, MD [facility]  08/19/2018  8:15 AM Emily Filbert, MD W[facility] W[facility]    Donnamae Jude, MD

## 2018-08-01 DIAGNOSIS — Z029 Encounter for administrative examinations, unspecified: Secondary | ICD-10-CM

## 2018-08-01 LAB — CULTURE, BETA STREP (GROUP B ONLY): Strep Gp B Culture: NEGATIVE

## 2018-08-02 LAB — GC/CHLAMYDIA PROBE AMP (~~LOC~~) NOT AT ARMC
Chlamydia: NEGATIVE
Neisseria Gonorrhea: NEGATIVE

## 2018-08-03 ENCOUNTER — Telehealth: Payer: Self-pay | Admitting: Obstetrics & Gynecology

## 2018-08-03 NOTE — Telephone Encounter (Signed)
Left a voicemail message instructing the patient of how enter the virtual visit via mychart. Informed the patient is she has any questions please call our office at [phone]. °

## 2018-08-04 ENCOUNTER — Other Ambulatory Visit: Payer: Self-pay

## 2018-08-04 ENCOUNTER — Telehealth (INDEPENDENT_AMBULATORY_CARE_PROVIDER_SITE_OTHER): Payer: Medicaid Other | Admitting: Obstetrics and Gynecology

## 2018-08-04 DIAGNOSIS — O2233 Deep phlebothrombosis in pregnancy, third trimester: Secondary | ICD-10-CM

## 2018-08-04 DIAGNOSIS — O99013 Anemia complicating pregnancy, third trimester: Secondary | ICD-10-CM

## 2018-08-04 DIAGNOSIS — O0993 Supervision of high risk pregnancy, unspecified, third trimester: Secondary | ICD-10-CM | POA: Diagnosis not present

## 2018-08-04 DIAGNOSIS — Z8632 Personal history of gestational diabetes: Secondary | ICD-10-CM

## 2018-08-04 DIAGNOSIS — O2230 Deep phlebothrombosis in pregnancy, unspecified trimester: Secondary | ICD-10-CM

## 2018-08-04 DIAGNOSIS — Z3A37 37 weeks gestation of pregnancy: Secondary | ICD-10-CM | POA: Diagnosis not present

## 2018-08-04 DIAGNOSIS — O0990 Supervision of high risk pregnancy, unspecified, unspecified trimester: Secondary | ICD-10-CM

## 2018-08-04 NOTE — Progress Notes (Signed)
I connected with  [name] on 08/04/18 at 10:15 AM EDT by telephone and verified that I am speaking with the correct person using two identifiers.   I discussed the limitations, risks, security and privacy concerns of performing an evaluation and management service by telephone and the availability of in person appointments. I also discussed with the patient that there may be a patient responsible charge related to this service. The patient expressed understanding and agreed to proceed.  Dolores Hoose, RN 08/04/2018  10:14 AM

## 2018-08-04 NOTE — Progress Notes (Signed)
   TELEHEALTH VIRTUAL OBSTETRICS VISIT ENCOUNTER NOTE  I connected with Marie Wade on 08/04/18 at 10:15 AM EDT by telephone at home and verified that I am speaking with the correct person using two identifiers.   I discussed the limitations, risks, security and privacy concerns of performing an evaluation and management service by telephone and the availability of in person appointments. I also discussed with the patient that there may be a patient responsible charge related to this service. The patient expressed understanding and agreed to proceed.  Subjective:  Marie Wade is a 28 y.o. [id number] at [redacted]w[redacted]d being followed for ongoing prenatal care.  She is currently monitored for the following issues for this high-risk pregnancy and has DVT (deep vein thrombosis) in pregnancy; Supervision of high risk pregnancy, antepartum; History of gestational diabetes; and Anemia in pregnancy on their problem list.  Patient reports no complaints. Reports fetal movement. Denies any contractions, bleeding or leaking of fluid.   The following portions of the patient's history were reviewed and updated as appropriate: allergies, current medications, past family history, past medical history, past social history, past surgical history and problem list.   Objective:  There were no vitals filed for this visit.  Babyscripts Data Reviewed: yes  General:  Alert, oriented and cooperative.   Mental Status: Normal mood and affect perceived. Normal judgment and thought content.  Rest of physical exam deferred due to type of encounter  Assessment and Plan:  Pregnancy: N2D7824 at [redacted]w[redacted]d 1. Anemia during pregnancy in third trimester Stable in the high 10 range  2. Supervision of high risk pregnancy, antepartum  3. History of gestational diabetes No issues  4. DVT (deep vein thrombosis) in pregnancy Pt states she hasn't taken her lovenox 40 bid for a week b/c she doesn't like the discomfort of the injections.  Pt dx in 1st trimester in the LLE. I told her I recommend she restart at 40 bid and let us know any PE s/s (reviewed with her). I also reviewed with her recommend IOL at 39wks so can have her schedule to hold lovenox 24 hours prior to IOL. She is amenable to this.   Term labor symptoms and general obstetric precautions including but not limited to vaginal bleeding, contractions, leaking of fluid and fetal movement were reviewed in detail with the patient.  I discussed the assessment and treatment plan with the patient. The patient was provided an opportunity to ask questions and all were answered. The patient agreed with the plan and demonstrated an understanding of the instructions. The patient was advised to call back or seek an in-person office evaluation/go to MAU at [facility] for any urgent or concerning symptoms. Please refer to After Visit Summary for other counseling recommendations.   I provided 10 minutes of non-face-to-face time during this encounter. The visit was conducted via MyChart-medicine  No follow-ups on file.  Future Appointments  Date Time Provider [place]  08/11/2018  8:15 AM Nicolette Bang, DO [place]  08/18/2018 12:00 AM MC-LD [place] None    Aletha Halim, MD Center for [facility], [place]

## 2018-08-09 ENCOUNTER — Encounter: Payer: Self-pay | Admitting: *Deleted

## 2018-08-09 ENCOUNTER — Encounter (HOSPITAL_COMMUNITY): Payer: Self-pay | Admitting: *Deleted

## 2018-08-09 ENCOUNTER — Telehealth (HOSPITAL_COMMUNITY): Payer: Self-pay | Admitting: *Deleted

## 2018-08-09 NOTE — Telephone Encounter (Signed)
Preadmission screen  

## 2018-08-10 ENCOUNTER — Telehealth: Payer: Self-pay | Admitting: Internal Medicine

## 2018-08-10 NOTE — Telephone Encounter (Signed)
Spoke with patient about her appointment on 7/9 @ 8:15. Patient instructed that the visit is a Advertising account planner visit. Patient verbalized she has the app downloaded. Patient stated that she is scheduled for covid testing on 7/14 and expressed she does not want to have it do. Patient instructed that it is required for when she has her baby and if it is not completed they will treat her as if she is positive. Patient expressed she still does not want to have it done and will not be attending the appointment.

## 2018-08-11 ENCOUNTER — Telehealth (INDEPENDENT_AMBULATORY_CARE_PROVIDER_SITE_OTHER): Payer: Medicaid Other | Admitting: Internal Medicine

## 2018-08-11 VITALS — BP 106/86 | HR 114

## 2018-08-11 DIAGNOSIS — Z3A38 38 weeks gestation of pregnancy: Secondary | ICD-10-CM | POA: Diagnosis not present

## 2018-08-11 DIAGNOSIS — O0993 Supervision of high risk pregnancy, unspecified, third trimester: Secondary | ICD-10-CM | POA: Diagnosis not present

## 2018-08-11 DIAGNOSIS — O99013 Anemia complicating pregnancy, third trimester: Secondary | ICD-10-CM

## 2018-08-11 DIAGNOSIS — O2233 Deep phlebothrombosis in pregnancy, third trimester: Secondary | ICD-10-CM | POA: Diagnosis not present

## 2018-08-11 DIAGNOSIS — Z8632 Personal history of gestational diabetes: Secondary | ICD-10-CM

## 2018-08-11 DIAGNOSIS — O0990 Supervision of high risk pregnancy, unspecified, unspecified trimester: Secondary | ICD-10-CM

## 2018-08-11 DIAGNOSIS — O2230 Deep phlebothrombosis in pregnancy, unspecified trimester: Secondary | ICD-10-CM

## 2018-08-11 NOTE — Progress Notes (Signed)
   TELEHEALTH VIRTUAL OBSTETRICS VISIT ENCOUNTER NOTE  I connected with Marie Wade on 08/11/18 at  8:15 AM EDT by telephone at home and verified that I am speaking with the correct person using two identifiers.   I discussed the limitations, risks, security and privacy concerns of performing an evaluation and management service by telephone and the availability of in person appointments. I also discussed with the patient that there may be a patient responsible charge related to this service. The patient expressed understanding and agreed to proceed.  Subjective:  Marie Wade is a 28 y.o. [id number] at [redacted]w[redacted]d being followed for ongoing prenatal care.  She is currently monitored for the following issues for this high-risk pregnancy and has DVT (deep vein thrombosis) in pregnancy; Supervision of high risk pregnancy, antepartum; History of gestational diabetes; and Anemia in pregnancy on their problem list.  Patient reports no complaints. Reports fetal movement. Denies any contractions, bleeding or leaking of fluid.   The following portions of the patient's history were reviewed and updated as appropriate: allergies, current medications, past family history, past medical history, past social history, past surgical history and problem list.   Objective:   General:  Alert, oriented and cooperative.   Mental Status: Normal mood and affect perceived. Normal judgment and thought content.  Rest of physical exam deferred due to type of encounter  Assessment and Plan:  Pregnancy: U2V2536 at [redacted]w[redacted]d  1. Anemia during pregnancy in third trimester Reviewed last HgB of 10.8 and that patient is on Fe supplements. She is taking it every other day due to constipation. Denies symptoms of anemia.   2. Supervision of high risk pregnancy, antepartum Scheduled for IOL on 7/16. Patient has no questions about induction process. She declines pre-admission COVID screening. Discussed that she will be treated like a  PUI during hospitalization.   3. DVT (deep vein thrombosis) in pregnancy Patient is compliant with Lovenox.    Term labor symptoms and general obstetric precautions including but not limited to vaginal bleeding, contractions, leaking of fluid and fetal movement were reviewed in detail with the patient.  I discussed the assessment and treatment plan with the patient. The patient was provided an opportunity to ask questions and all were answered. The patient agreed with the plan and demonstrated an understanding of the instructions. The patient was advised to call back or seek an in-person office evaluation/go to MAU at [facility] for any urgent or concerning symptoms. Please refer to After Visit Summary for other counseling recommendations.   I provided 11 minutes of non-face-to-face time during this encounter.  No follow-ups on file.  Future Appointments  Date Time Provider [place]  08/16/2018  7:30 AM MC-MAU 1 MC-INDC None  08/18/2018 12:00 AM MC-LD [place] None    [place] for [employer], [place]

## 2018-08-15 ENCOUNTER — Encounter: Payer: Self-pay | Admitting: *Deleted

## 2018-08-16 ENCOUNTER — Other Ambulatory Visit (HOSPITAL_COMMUNITY)
Admission: RE | Admit: 2018-08-16 | Discharge: 2018-08-16 | Disposition: A | Discharge status: Home / Self Care | Payer: Medicaid Other | Source: Ambulatory Visit | Attending: Family Medicine | Admitting: Family Medicine

## 2018-08-16 ENCOUNTER — Other Ambulatory Visit: Payer: Self-pay | Admitting: Advanced Practice Midwife

## 2018-08-16 DIAGNOSIS — Z029 Encounter for administrative examinations, unspecified: Secondary | ICD-10-CM

## 2018-08-18 ENCOUNTER — Encounter (HOSPITAL_COMMUNITY): Payer: Self-pay

## 2018-08-18 ENCOUNTER — Inpatient Hospital Stay (HOSPITAL_COMMUNITY): Payer: Medicaid Other

## 2018-08-18 ENCOUNTER — Other Ambulatory Visit: Payer: Self-pay

## 2018-08-18 ENCOUNTER — Telehealth: Payer: Medicaid Other | Admitting: Obstetrics & Gynecology

## 2018-08-18 ENCOUNTER — Inpatient Hospital Stay (HOSPITAL_COMMUNITY)
Admission: AD | Admit: 2018-08-18 | Discharge: 2018-08-21 | DRG: 788 | Disposition: A | Discharge status: Home / Self Care | Payer: Medicaid Other | Attending: Family Medicine | Admitting: Family Medicine

## 2018-08-18 DIAGNOSIS — O2230 Deep phlebothrombosis in pregnancy, unspecified trimester: Secondary | ICD-10-CM | POA: Diagnosis present

## 2018-08-18 DIAGNOSIS — D649 Anemia, unspecified: Secondary | ICD-10-CM | POA: Diagnosis not present

## 2018-08-18 DIAGNOSIS — Z9119 Patient's noncompliance with other medical treatment and regimen: Secondary | ICD-10-CM | POA: Diagnosis not present

## 2018-08-18 DIAGNOSIS — O322XX0 Maternal care for transverse and oblique lie, not applicable or unspecified: Secondary | ICD-10-CM | POA: Diagnosis not present

## 2018-08-18 DIAGNOSIS — O26893 Other specified pregnancy related conditions, third trimester: Principal | ICD-10-CM | POA: Diagnosis present

## 2018-08-18 DIAGNOSIS — O9902 Anemia complicating childbirth: Secondary | ICD-10-CM | POA: Diagnosis present

## 2018-08-18 DIAGNOSIS — O0990 Supervision of high risk pregnancy, unspecified, unspecified trimester: Secondary | ICD-10-CM

## 2018-08-18 DIAGNOSIS — Z86718 Personal history of other venous thrombosis and embolism: Secondary | ICD-10-CM | POA: Diagnosis not present

## 2018-08-18 DIAGNOSIS — Z98891 History of uterine scar from previous surgery: Secondary | ICD-10-CM

## 2018-08-18 DIAGNOSIS — Z3A39 39 weeks gestation of pregnancy: Secondary | ICD-10-CM | POA: Diagnosis not present

## 2018-08-18 DIAGNOSIS — Z8632 Personal history of gestational diabetes: Secondary | ICD-10-CM

## 2018-08-18 DIAGNOSIS — O99013 Anemia complicating pregnancy, third trimester: Secondary | ICD-10-CM

## 2018-08-18 LAB — CBC
HCT: 38.6 % (ref 36.0–46.0)
Hemoglobin: 12.5 g/dL (ref 12.0–15.0)
MCH: 28 pg (ref 26.0–34.0)
MCHC: 32.4 g/dL (ref 30.0–36.0)
MCV: 86.4 fL (ref 80.0–100.0)
Platelets: 158 10*3/uL (ref 150–400)
RBC: 4.47 MIL/uL (ref 3.87–5.11)
RDW: 13.3 % (ref 11.5–15.5)
WBC: 8.3 10*3/uL (ref 4.0–10.5)
nRBC: 0 % (ref 0.0–0.2)

## 2018-08-18 LAB — TYPE AND SCREEN
ABO/RH(D): B POS
Antibody Screen: NEGATIVE

## 2018-08-18 LAB — RPR: RPR Ser Ql: NONREACTIVE

## 2018-08-18 MED ORDER — OXYTOCIN BOLUS FROM INFUSION: 500.0000 mL | Freq: Once | INTRAVENOUS | Status: DC

## 2018-08-18 MED ORDER — LACTATED RINGERS IV SOLN
500.0000 mL | Freq: Once | INTRAVENOUS | Status: AC
  Administered 2018-08-18: 500 mL via INTRAVENOUS

## 2018-08-18 MED ORDER — FENTANYL-BUPIVACAINE-NACL 0.5-0.125-0.9 MG/250ML-% EP SOLN
EPIDURAL | Status: AC
  Filled 2018-08-18: qty 250

## 2018-08-18 MED ORDER — ACETAMINOPHEN 325 MG PO TABS: 650.0000 mg | ORAL_TABLET | ORAL | Status: DC | PRN

## 2018-08-18 MED ORDER — ONDANSETRON HCL 4 MG/2ML IJ SOLN: 4.0000 mg | Freq: Four times a day (QID) | INTRAMUSCULAR | Status: DC | PRN

## 2018-08-18 MED ORDER — LIDOCAINE HCL (PF) 1 % IJ SOLN: 30.0000 mL | INTRAMUSCULAR | Status: DC | PRN

## 2018-08-18 MED ORDER — TERBUTALINE SULFATE 1 MG/ML IJ SOLN: 0.2500 mg | Freq: Once | INTRAMUSCULAR | Status: DC | PRN

## 2018-08-18 MED ORDER — PHENYLEPHRINE 40 MCG/ML (10ML) SYRINGE FOR IV PUSH (FOR BLOOD PRESSURE SUPPORT): 80.0000 ug | PREFILLED_SYRINGE | INTRAVENOUS | Status: DC | PRN

## 2018-08-18 MED ORDER — OXYTOCIN 40 UNITS IN NORMAL SALINE INFUSION - SIMPLE MED
1.0000 m[IU]/min | INTRAVENOUS | Status: DC
  Administered 2018-08-18: 2 m[IU]/min via INTRAVENOUS

## 2018-08-18 MED ORDER — MISOPROSTOL 50MCG HALF TABLET
50.0000 ug | ORAL_TABLET | ORAL | Status: DC | PRN
  Administered 2018-08-18 (×2): 50 ug via ORAL
  Filled 2018-08-18 (×2): qty 1

## 2018-08-18 MED ORDER — OXYTOCIN 40 UNITS IN NORMAL SALINE INFUSION - SIMPLE MED
2.5000 [IU]/h | INTRAVENOUS | Status: DC
  Filled 2018-08-18: qty 1000

## 2018-08-18 MED ORDER — PHENYLEPHRINE 40 MCG/ML (10ML) SYRINGE FOR IV PUSH (FOR BLOOD PRESSURE SUPPORT)
PREFILLED_SYRINGE | INTRAVENOUS | Status: AC
  Filled 2018-08-18: qty 10

## 2018-08-18 MED ORDER — LACTATED RINGERS IV SOLN
INTRAVENOUS | Status: DC
  Administered 2018-08-18 – 2018-08-19 (×7): via INTRAVENOUS

## 2018-08-18 MED ORDER — FENTANYL CITRATE (PF) 100 MCG/2ML IJ SOLN
100.0000 ug | INTRAMUSCULAR | Status: DC | PRN
  Administered 2018-08-18: 100 ug via INTRAVENOUS
  Filled 2018-08-18: qty 2

## 2018-08-18 MED ORDER — EPHEDRINE 5 MG/ML INJ: 10.0000 mg | INTRAVENOUS | Status: DC | PRN

## 2018-08-18 MED ORDER — FENTANYL-BUPIVACAINE-NACL 0.5-0.125-0.9 MG/250ML-% EP SOLN: 12.0000 mL/h | EPIDURAL | Status: DC | PRN

## 2018-08-18 MED ORDER — FENTANYL CITRATE (PF) 100 MCG/2ML IJ SOLN
50.0000 ug | INTRAMUSCULAR | Status: DC | PRN
  Administered 2018-08-18: 23:00:00 50 ug via INTRAVENOUS
  Filled 2018-08-18: qty 2

## 2018-08-18 MED ORDER — SOD CITRATE-CITRIC ACID 500-334 MG/5ML PO SOLN
30.0000 mL | ORAL | Status: DC | PRN
  Administered 2018-08-19: 30 mL via ORAL
  Filled 2018-08-18: qty 30

## 2018-08-18 MED ORDER — DIPHENHYDRAMINE HCL 50 MG/ML IJ SOLN: 12.5000 mg | INTRAMUSCULAR | Status: DC | PRN

## 2018-08-18 MED ORDER — LACTATED RINGERS IV SOLN: 500.0000 mL | INTRAVENOUS | Status: DC | PRN

## 2018-08-18 MED ORDER — MISOPROSTOL 25 MCG QUARTER TABLET: 25.0000 ug | ORAL_TABLET | ORAL | Status: DC | PRN

## 2018-08-18 NOTE — H&P (Addendum)
cLABOR AND DELIVERY ADMISSION HISTORY AND PHYSICAL NOTE  Marie Wade is a 28 y.o. female [phone] with IUP at [redacted]w[redacted]d by U/S presenting for IOL due to h/o DVT (lovenox non-compliant).  She reports positive fetal movement. She denies leakage of fluid or vaginal bleeding.  Prenatal History/Complications: PNC at [facility] Pregnancy complications:  - h/o DVT (lovenox non-compliant) with this pregnancy @ [redacted] weeks gestation.  Past Medical History: Past Medical History:  Diagnosis Date  . DVT (deep vein thrombosis) in pregnancy     Past Surgical History: Past Surgical History:  Procedure Laterality Date  . DILATION AND CURETTAGE OF UTERUS      Obstetrical History: OB History    Gravida  6   Para  2   Term  2   Preterm  0   AB  3   Living  2     SAB  0   TAB  3   Ectopic  0   Multiple  0   Live Births  2           Social History: Social History   Socioeconomic History  . Marital status: Significant Other    Spouse name: Michaell Cowing  . Number of children: Not on file  . Years of education: Not on file  . Highest education level: Not on file  Occupational History  . Not on file  Social Needs  . Financial resource strain: Not hard at all  . Food insecurity    Worry: Never true    Inability: Never true  . Transportation needs    Medical: No    Non-medical: No  Tobacco Use  . Smoking status: Never Smoker  . Smokeless tobacco: Never Used  Substance and Sexual Activity  . Alcohol use: Never    Frequency: Never  . Drug use: Never  . Sexual activity: Yes    Birth control/protection: None  Lifestyle  . Physical activity    Days per week: Not on file    Minutes per session: Not on file  . Stress: Not on file  Relationships  . Social Herbalist on phone: Not on file    Gets together: Not on file    Attends religious service: Not on file    Active member of club or organization: Not on file    Attends meetings of clubs or organizations: Not on  file    Relationship status: Not on file  Other Topics Concern  . Not on file  Social History Narrative  . Not on file    Family History: Family History  Problem Relation Age of Onset  . Cancer Mother   . Hypertension Father   . Diabetes Father   . Heart disease Father     Allergies: Allergies  Allergen Reactions  . Watermelon [Citrullus Vulgaris] Itching    Medications Prior to Admission  Medication Sig Dispense Refill Last Dose  . enoxaparin (LOVENOX) 40 MG/0.4ML injection Inject 0.4 mLs (40 mg total) into the skin every 12 (twelve) hours. 240 mL 4 Past Week at Unknown time  . ferrous sulfate 325 (65 FE) MG tablet Take 1 tablet (325 mg total) by mouth daily with breakfast. 90 tablet 3 08/17/2018 at Unknown time  . prenatal vitamin w/FE, FA (PRENATAL 1 + 1) 27-1 MG TABS tablet Take 1 tablet by mouth daily at 12 noon. 30 each 11 08/18/2018 at Unknown time     Review of Systems  All systems reviewed and negative except as  stated in HPI  Physical Exam Blood pressure (!) 101/55, pulse 64, temperature 98.8 F (37.1 C), temperature source Oral, resp. rate 16, height 5\' 8"  (1.727 m), weight 86.6 kg, last menstrual period 11/18/2017, SpO2 100 %. General appearance: alert, oriented Lungs: normal respiratory effort Abdomen: soft, non-tender; gravid, FH appropriate for GA Extremities: No calf swelling or tenderness Presentation: cephalic Fetal monitoring: 130 bpm, moderate variability, 15 x 15 acels present, no decels Uterine activity: 2-4 mins Dilation: 1 Effacement (%): 40 Station: -3 Exam by:: Jordan Hawksierra Bodamer, RN  Prenatal labs: ABO, Rh: --/--/B POS, B POS Performed at [facility], [address] [place], [place] [address]  [phone](07/16 0050) Antibody: NEG Performed at [facility], [address] [place], [place] [address]  [phone](07/16 0050) Rubella: 2.19 (01/10 1149) RPR: Non Reactive (04/30 0842)  HBsAg: Negative (01/10 1149)  HIV: Non Reactive (04/30 0842)   GC/Chlamydia: Negative (06/25 0000) GBS:   Negative (06/25 1620) 2-hr GTT: WNL Genetic screening: Normal Anatomy US: Suboptimal views of fetal anatomy, otherwise normal  Prenatal Transfer Tool  Maternal Diabetes: No Genetic Screening: Normal Maternal Ultrasounds/Referrals: Normal Fetal Ultrasounds or other Referrals:  None Maternal Substance Abuse:  No Significant Maternal Medications:  Meds include: Other: Lovenox Significant Maternal Lab Results: Group B Strep negative  Results for orders placed or performed during the hospital encounter of 08/18/18 (from the past 24 hour(s))  CBC   Collection Time: 08/18/18 12:50 AM  Result Value Ref Range   WBC 8.3 4.0 - 10.5 K/uL   RBC 4.47 3.87 - 5.11 MIL/uL   Hemoglobin 12.5 12.0 - 15.0 g/dL   HCT 95.638.6 38.736.0 - 56.446.0 %   MCV 86.4 80.0 - 100.0 fL   MCH 28.0 26.0 - 34.0 pg   MCHC 32.4 30.0 - 36.0 g/dL   RDW 33.213.3 95.111.5 - 88.415.5 %   Platelets 158 150 - 400 K/uL   nRBC 0.0 0.0 - 0.2 %  Type and screen   Collection Time: 08/18/18 12:50 AM  Result Value Ref Range   ABO/RH(D) B POS    Antibody Screen      NEG Performed at [facility], [address] [place], [place] [address]    Sample Expiration PENDING   ABO/Rh   Collection Time: 08/18/18 12:50 AM  Result Value Ref Range   ABO/RH(D)      B POS Performed at [facility], [address] [place], [place] [address]     Patient Active Problem List   Diagnosis Date Noted  . Anemia in pregnancy 03/11/2018  . DVT (deep vein thrombosis) in pregnancy 02/11/2018  . Supervision of high risk pregnancy, antepartum 02/11/2018  . History of gestational diabetes 02/11/2018    Assessment: Marie Wade is a 28 y.o. [id number] at 748w0d here for IOL due to h/o DVT (lovenox non-compliant).  #Labor: Cytotec PO 50mcg given @0200 . Continue cytotec for cervical favorability. Consider FB placement and Pitocin later in the course of labor. Continue cervical exam for progression of  labor. #Pain: Maternally supported. May have epidural if desired. #FWB: Cat I #ID:  GBS negative #MOF: Breast #MOC: [place]   Lavonda JumboSimone Autry-Lott, DO Family Medicine Resident, PGY-1 08/18/2018, 4:08 AM   I confirm that I have verified the information documented in the resident's note and that I have also personally reperformed the physical exam and all medical decision making activities.  The patient was seen and examined by me also Agree with note NST reactive and reassuring  UCs as listed Cervical exams as listed in note  Aviva SignsWilliams, Zane Samson L, CNM

## 2018-08-18 NOTE — Progress Notes (Signed)
Labor Progress Note Dell Hurtubise is a 28 y.o. C4U8891 at [redacted]w[redacted]d presented for IOL for hx of DVT and Lovenox non-compliance  S:  Feeling some ctx. No c/o.   O:  BP 100/60   Pulse 80   Temp 99.1 F (37.3 C) (Oral)   Resp 15   Ht 5\' 8"  (1.727 m)   Wt 87.1 kg   LMP 11/18/2017 (Exact Date)   SpO2 100%   BMI 29.19 kg/m  EFM: baseline 140 bpm/ mod variability/ + accels/ no decels  Toco: 1-4 SVE: Dilation: 1 Effacement (%): 40 Cervical Position: Posterior Station: -3 Presentation: Vertex Exam by:: Colman Cater CNM  A/P: 28 y.o. [id number] [redacted]w[redacted]d  1. Labor: latent 2. FWB: Cat I 3. Pain: analgesia/anesthesia prn  S/p Cytotec x2. Consented for FB placement, FB placed, tolerated well. Continue Cytotec. Pitocin once foley out. Anticipate SVD.  Julianne Handler, CNM 10:15 AM

## 2018-08-18 NOTE — Progress Notes (Signed)
Patient Vitals for the past 4 hrs:  BP Temp Temp src Pulse Resp  08/18/18 2130 119/74 - - 70 -  08/18/18 2126 - 99.1 F (37.3 C) Oral - -  08/18/18 2100 119/67 - - 87 -  08/18/18 2030 115/75 - - 73 -  08/18/18 2000 121/75 - - 77 -  08/18/18 1949 - 99.9 F (37.7 C) Oral - -  08/18/18 1930 103/64 - - 78 -  08/18/18 1900 (!) 108/58 - - 71 15  08/18/18 1830 111/65 - - 70 15  08/18/18 1800 120/68 - - 74 15   cx 4/60/-2. AROM w/lg amount of clear fluid. FHR Cat 1. Ctx q 2-3 m inutes. ON 75mu/min of pitocin. Continue present mgt

## 2018-08-18 NOTE — Progress Notes (Signed)
Labor Progress Note Marie Wade is a 28 y.o. U7O5366 at [redacted]w[redacted]d presented for hx DVT, non-compliance of Lovenox  S:  Feeling some ctx, declines need for pain meds.  O:  BP (!) 98/57   Pulse 72   Temp 98.4 F (36.9 C) (Oral)   Resp 15   Ht 5\' 8"  (1.727 m)   Wt 87.1 kg   LMP 11/18/2017 (Exact Date)   SpO2 100%   BMI 29.19 kg/m  EFM: baseline 130 bpm/ mod variability/ + accels/ no decels  Toco: 1-3 SVE: deferred Pitocin: 8 mu/min  A/P: 28 y.o. Y4I3474 [redacted]w[redacted]d  1. Labor: latent 2. FWB: Cat I 3. Pain: analgesia/anesthesia prn  S/p Cytotec and FB. Continue Pitocin. Anticipate labor progress and SVD.  Julianne Handler, CNM 3:36 PM

## 2018-08-19 ENCOUNTER — Encounter: Payer: Medicaid Other | Admitting: Obstetrics & Gynecology

## 2018-08-19 ENCOUNTER — Inpatient Hospital Stay (HOSPITAL_COMMUNITY): Payer: Medicaid Other | Admitting: Anesthesiology

## 2018-08-19 ENCOUNTER — Encounter (HOSPITAL_COMMUNITY): Payer: Self-pay | Admitting: Family Medicine

## 2018-08-19 ENCOUNTER — Encounter (HOSPITAL_COMMUNITY)
Admission: AD | Disposition: A | Discharge status: Home / Self Care | Payer: Self-pay | Source: Home / Self Care | Attending: Family Medicine

## 2018-08-19 DIAGNOSIS — O322XX0 Maternal care for transverse and oblique lie, not applicable or unspecified: Secondary | ICD-10-CM

## 2018-08-19 DIAGNOSIS — Z3A39 39 weeks gestation of pregnancy: Secondary | ICD-10-CM

## 2018-08-19 LAB — CBC
HCT: 23.8 % — ABNORMAL LOW (ref 36.0–46.0)
Hemoglobin: 7.7 g/dL — ABNORMAL LOW (ref 12.0–15.0)
MCH: 28.4 pg (ref 26.0–34.0)
MCHC: 32.4 g/dL (ref 30.0–36.0)
MCV: 87.8 fL (ref 80.0–100.0)
Platelets: DECREASED 10*3/uL (ref 150–400)
RBC: 2.71 MIL/uL — ABNORMAL LOW (ref 3.87–5.11)
RDW: 13.2 % (ref 11.5–15.5)
WBC: 12.1 10*3/uL — ABNORMAL HIGH (ref 4.0–10.5)
nRBC: 0 % (ref 0.0–0.2)

## 2018-08-19 LAB — CREATININE, SERUM
Creatinine, Ser: 0.6 mg/dL (ref 0.44–1.00)
GFR calc Af Amer: >60 mL/min (ref >60)
GFR calc non Af Amer: >60 mL/min (ref >60)

## 2018-08-19 LAB — ABO/RH: ABO/RH(D): B POS

## 2018-08-19 SURGERY — Surgical Case
Anesthesia: Spinal | Wound class: Clean Contaminated

## 2018-08-19 MED ORDER — NALBUPHINE HCL 10 MG/ML IJ SOLN: 5.0000 mg | INTRAMUSCULAR | Status: DC | PRN

## 2018-08-19 MED ORDER — ALBUMIN HUMAN 5 % IV SOLN
INTRAVENOUS | Status: AC
  Filled 2018-08-19: qty 250

## 2018-08-19 MED ORDER — ONDANSETRON HCL 4 MG/2ML IJ SOLN
INTRAMUSCULAR | Status: AC
  Filled 2018-08-19: qty 2

## 2018-08-19 MED ORDER — MENTHOL 3 MG MT LOZG: 1.0000 | LOZENGE | OROMUCOSAL | Status: DC | PRN

## 2018-08-19 MED ORDER — SODIUM CHLORIDE 0.9 % IV SOLN
INTRAVENOUS | Status: AC
  Filled 2018-08-19: qty 500

## 2018-08-19 MED ORDER — BUPIVACAINE IN DEXTROSE 0.75-8.25 % IT SOLN
INTRATHECAL | Status: DC | PRN
  Administered 2018-08-19: 1.6 mL via INTRATHECAL

## 2018-08-19 MED ORDER — PROMETHAZINE HCL 25 MG/ML IJ SOLN: 6.2500 mg | INTRAMUSCULAR | Status: DC | PRN

## 2018-08-19 MED ORDER — DIBUCAINE (PERIANAL) 1 % EX OINT: 1.0000 "application " | TOPICAL_OINTMENT | CUTANEOUS | Status: DC | PRN

## 2018-08-19 MED ORDER — SODIUM CHLORIDE 0.9 % IV SOLN
500.0000 mg | INTRAVENOUS | Status: AC
  Administered 2018-08-19: 01:00:00 500 mg via INTRAVENOUS

## 2018-08-19 MED ORDER — BUPIVACAINE HCL (PF) 0.25 % IJ SOLN
INTRAMUSCULAR | Status: DC | PRN
  Administered 2018-08-19: 30 mL

## 2018-08-19 MED ORDER — DIPHENHYDRAMINE HCL 50 MG/ML IJ SOLN: 12.5000 mg | INTRAMUSCULAR | Status: DC | PRN

## 2018-08-19 MED ORDER — ONDANSETRON HCL 4 MG/2ML IJ SOLN: 4.0000 mg | Freq: Three times a day (TID) | INTRAMUSCULAR | Status: DC | PRN

## 2018-08-19 MED ORDER — DIPHENHYDRAMINE HCL 25 MG PO CAPS: 25.0000 mg | ORAL_CAPSULE | Freq: Four times a day (QID) | ORAL | Status: DC | PRN

## 2018-08-19 MED ORDER — OXYCODONE HCL 5 MG PO TABS: 5.0000 mg | ORAL_TABLET | Freq: Once | ORAL | Status: DC | PRN

## 2018-08-19 MED ORDER — LIDOCAINE-EPINEPHRINE (PF) 2 %-1:200000 IJ SOLN
INTRAMUSCULAR | Status: AC
  Filled 2018-08-19: qty 10

## 2018-08-19 MED ORDER — OXYTOCIN 40 UNITS IN NORMAL SALINE INFUSION - SIMPLE MED
INTRAVENOUS | Status: AC
  Filled 2018-08-19: qty 1000

## 2018-08-19 MED ORDER — SIMETHICONE 80 MG PO CHEW: 80.0000 mg | CHEWABLE_TABLET | ORAL | Status: DC | PRN

## 2018-08-19 MED ORDER — SIMETHICONE 80 MG PO CHEW
80.0000 mg | CHEWABLE_TABLET | ORAL | Status: DC
  Administered 2018-08-20: 80 mg via ORAL

## 2018-08-19 MED ORDER — WITCH HAZEL-GLYCERIN EX PADS: 1.0000 "application " | MEDICATED_PAD | CUTANEOUS | Status: DC | PRN

## 2018-08-19 MED ORDER — HYDROMORPHONE HCL 1 MG/ML IJ SOLN: 0.2500 mg | INTRAMUSCULAR | Status: DC | PRN

## 2018-08-19 MED ORDER — COCONUT OIL OIL: 1.0000 "application " | TOPICAL_OIL | Status: DC | PRN

## 2018-08-19 MED ORDER — ALBUMIN HUMAN 5 % IV SOLN
INTRAVENOUS | Status: DC | PRN
  Administered 2018-08-19 (×2): via INTRAVENOUS

## 2018-08-19 MED ORDER — BUPIVACAINE HCL (PF) 0.25 % IJ SOLN
INTRAMUSCULAR | Status: AC
  Filled 2018-08-19: qty 40

## 2018-08-19 MED ORDER — KETOROLAC TROMETHAMINE 30 MG/ML IJ SOLN
30.0000 mg | Freq: Once | INTRAMUSCULAR | Status: AC
  Administered 2018-08-19: 30 mg via INTRAVENOUS
  Filled 2018-08-19: qty 1

## 2018-08-19 MED ORDER — DIPHENHYDRAMINE HCL 25 MG PO CAPS: 25.0000 mg | ORAL_CAPSULE | ORAL | Status: DC | PRN

## 2018-08-19 MED ORDER — NALBUPHINE HCL 10 MG/ML IJ SOLN: 5.0000 mg | Freq: Once | INTRAMUSCULAR | Status: DC | PRN

## 2018-08-19 MED ORDER — TETANUS-DIPHTH-ACELL PERTUSSIS 5-2.5-18.5 LF-MCG/0.5 IM SUSP: 0.5000 mL | Freq: Once | INTRAMUSCULAR | Status: DC

## 2018-08-19 MED ORDER — MEPERIDINE HCL 25 MG/ML IJ SOLN: 6.2500 mg | INTRAMUSCULAR | Status: DC | PRN

## 2018-08-19 MED ORDER — SCOPOLAMINE 1 MG/3DAYS TD PT72: 1.0000 | MEDICATED_PATCH | Freq: Once | TRANSDERMAL | Status: DC

## 2018-08-19 MED ORDER — PROPOFOL 10 MG/ML IV BOLUS
INTRAVENOUS | Status: AC
  Filled 2018-08-19: qty 20

## 2018-08-19 MED ORDER — PHENYLEPHRINE HCL-NACL 20-0.9 MG/250ML-% IV SOLN
INTRAVENOUS | Status: DC | PRN
  Administered 2018-08-19: 60 ug/min via INTRAVENOUS

## 2018-08-19 MED ORDER — FENTANYL CITRATE (PF) 100 MCG/2ML IJ SOLN
INTRAMUSCULAR | Status: DC | PRN
  Administered 2018-08-19: 15 ug via INTRATHECAL

## 2018-08-19 MED ORDER — DEXAMETHASONE SODIUM PHOSPHATE 10 MG/ML IJ SOLN
INTRAMUSCULAR | Status: DC | PRN
  Administered 2018-08-19: 10 mg via INTRAVENOUS

## 2018-08-19 MED ORDER — SENNOSIDES-DOCUSATE SODIUM 8.6-50 MG PO TABS
2.0000 | ORAL_TABLET | ORAL | Status: DC
  Administered 2018-08-20 (×2): 2 via ORAL
  Filled 2018-08-19 (×2): qty 2

## 2018-08-19 MED ORDER — FENTANYL CITRATE (PF) 100 MCG/2ML IJ SOLN
INTRAMUSCULAR | Status: AC
  Filled 2018-08-19: qty 2

## 2018-08-19 MED ORDER — GABAPENTIN 100 MG PO CAPS
100.0000 mg | ORAL_CAPSULE | Freq: Three times a day (TID) | ORAL | Status: DC
  Administered 2018-08-19 – 2018-08-21 (×7): 100 mg via ORAL
  Filled 2018-08-19 (×7): qty 1

## 2018-08-19 MED ORDER — SIMETHICONE 80 MG PO CHEW
80.0000 mg | CHEWABLE_TABLET | Freq: Three times a day (TID) | ORAL | Status: DC
  Administered 2018-08-19 – 2018-08-21 (×7): 80 mg via ORAL
  Filled 2018-08-19 (×9): qty 1

## 2018-08-19 MED ORDER — OXYCODONE HCL 5 MG/5ML PO SOLN: 5.0000 mg | Freq: Once | ORAL | Status: DC | PRN

## 2018-08-19 MED ORDER — ONDANSETRON HCL 4 MG/2ML IJ SOLN
INTRAMUSCULAR | Status: DC | PRN
  Administered 2018-08-19: 4 mg via INTRAVENOUS

## 2018-08-19 MED ORDER — PHENYLEPHRINE HCL (PRESSORS) 10 MG/ML IV SOLN
INTRAVENOUS | Status: DC | PRN
  Administered 2018-08-19 (×5): 200 ug via INTRAVENOUS

## 2018-08-19 MED ORDER — ENOXAPARIN SODIUM 40 MG/0.4ML ~~LOC~~ SOLN: 40.0000 mg | Freq: Two times a day (BID) | SUBCUTANEOUS | Status: DC

## 2018-08-19 MED ORDER — DEXTROSE IN LACTATED RINGERS 5 % IV SOLN: INTRAVENOUS | Status: DC

## 2018-08-19 MED ORDER — SODIUM CHLORIDE 0.9 % IR SOLN
Status: DC | PRN
  Administered 2018-08-19: 1

## 2018-08-19 MED ORDER — ZOLPIDEM TARTRATE 5 MG PO TABS: 5.0000 mg | ORAL_TABLET | Freq: Every evening | ORAL | Status: DC | PRN

## 2018-08-19 MED ORDER — PHENYLEPHRINE HCL-NACL 20-0.9 MG/250ML-% IV SOLN
INTRAVENOUS | Status: AC
  Filled 2018-08-19: qty 250

## 2018-08-19 MED ORDER — NALOXONE HCL 0.4 MG/ML IJ SOLN: 0.4000 mg | INTRAMUSCULAR | Status: DC | PRN

## 2018-08-19 MED ORDER — DEXAMETHASONE SODIUM PHOSPHATE 10 MG/ML IJ SOLN
INTRAMUSCULAR | Status: AC
  Filled 2018-08-19: qty 1

## 2018-08-19 MED ORDER — OXYCODONE HCL 5 MG PO TABS: 5.0000 mg | ORAL_TABLET | ORAL | Status: DC | PRN

## 2018-08-19 MED ORDER — MEASLES, MUMPS & RUBELLA VAC IJ SOLR: 0.5000 mL | Freq: Once | INTRAMUSCULAR | Status: DC

## 2018-08-19 MED ORDER — CEFAZOLIN SODIUM-DEXTROSE 2-4 GM/100ML-% IV SOLN
2.0000 g | Freq: Once | INTRAVENOUS | Status: AC
  Administered 2018-08-19: 01:00:00 2 g via INTRAVENOUS

## 2018-08-19 MED ORDER — EPHEDRINE SULFATE 50 MG/ML IJ SOLN
INTRAMUSCULAR | Status: DC | PRN
  Administered 2018-08-19: 30 mg via INTRAVENOUS

## 2018-08-19 MED ORDER — EPHEDRINE 5 MG/ML INJ
INTRAVENOUS | Status: AC
  Filled 2018-08-19: qty 10

## 2018-08-19 MED ORDER — IBUPROFEN 600 MG PO TABS
600.0000 mg | ORAL_TABLET | Freq: Four times a day (QID) | ORAL | Status: DC
  Administered 2018-08-19 – 2018-08-21 (×8): 600 mg via ORAL
  Filled 2018-08-19 (×8): qty 1

## 2018-08-19 MED ORDER — KETOROLAC TROMETHAMINE 30 MG/ML IJ SOLN: 30.0000 mg | Freq: Once | INTRAMUSCULAR | Status: DC | PRN

## 2018-08-19 MED ORDER — ACETAMINOPHEN 325 MG PO TABS
650.0000 mg | ORAL_TABLET | Freq: Four times a day (QID) | ORAL | Status: DC | PRN
  Administered 2018-08-19 – 2018-08-21 (×2): 650 mg via ORAL
  Filled 2018-08-19 (×2): qty 2

## 2018-08-19 MED ORDER — PHENYLEPHRINE 40 MCG/ML (10ML) SYRINGE FOR IV PUSH (FOR BLOOD PRESSURE SUPPORT)
PREFILLED_SYRINGE | INTRAVENOUS | Status: AC
  Filled 2018-08-19: qty 20

## 2018-08-19 MED ORDER — SODIUM CHLORIDE 0.9 % IV SOLN
INTRAVENOUS | Status: DC | PRN
  Administered 2018-08-19: 01:00:00 40 [IU] via INTRAVENOUS

## 2018-08-19 MED ORDER — ENOXAPARIN SODIUM 40 MG/0.4ML ~~LOC~~ SOLN
40.0000 mg | Freq: Two times a day (BID) | SUBCUTANEOUS | Status: DC
  Administered 2018-08-20 (×3): 40 mg via SUBCUTANEOUS
  Filled 2018-08-19 (×3): qty 0.4

## 2018-08-19 MED ORDER — SODIUM CHLORIDE 0.9% FLUSH: 3.0000 mL | INTRAVENOUS | Status: DC | PRN

## 2018-08-19 MED ORDER — SODIUM CHLORIDE (PF) 0.9 % IJ SOLN
INTRAMUSCULAR | Status: AC
  Filled 2018-08-19: qty 10

## 2018-08-19 MED ORDER — MORPHINE SULFATE (PF) 0.5 MG/ML IJ SOLN
INTRAMUSCULAR | Status: DC | PRN
  Administered 2018-08-19: .15 mg via INTRATHECAL

## 2018-08-19 MED ORDER — SODIUM CHLORIDE 0.9 % IV SOLN
INTRAVENOUS | Status: DC | PRN
  Administered 2018-08-19: 01:00:00 via INTRAVENOUS

## 2018-08-19 MED ORDER — MORPHINE SULFATE (PF) 0.5 MG/ML IJ SOLN
INTRAMUSCULAR | Status: AC
  Filled 2018-08-19: qty 10

## 2018-08-19 MED ORDER — ENOXAPARIN SODIUM 40 MG/0.4ML ~~LOC~~ SOLN: 40.0000 mg | SUBCUTANEOUS | Status: DC

## 2018-08-19 MED ORDER — NALOXONE HCL 4 MG/10ML IJ SOLN
1.0000 ug/kg/h | INTRAVENOUS | Status: DC | PRN
  Filled 2018-08-19: qty 5

## 2018-08-19 MED ORDER — FERROUS FUMARATE 324 (106 FE) MG PO TABS
1.0000 | ORAL_TABLET | Freq: Two times a day (BID) | ORAL | Status: DC
  Administered 2018-08-20 – 2018-08-21 (×3): 106 mg via ORAL
  Filled 2018-08-19 (×4): qty 1

## 2018-08-19 MED ORDER — OXYTOCIN 40 UNITS IN NORMAL SALINE INFUSION - SIMPLE MED: 2.5000 [IU]/h | INTRAVENOUS | Status: AC

## 2018-08-19 MED ORDER — PRENATAL MULTIVITAMIN CH
1.0000 | ORAL_TABLET | Freq: Every day | ORAL | Status: DC
  Administered 2018-08-19 – 2018-08-20 (×2): 1 via ORAL
  Filled 2018-08-19 (×2): qty 1

## 2018-08-19 SURGICAL SUPPLY — 36 items
BENZOIN TINCTURE PRP APPL 2/3 (GAUZE/BANDAGES/DRESSINGS) ×3 IMPLANT
CHLORAPREP W/TINT 26ML (MISCELLANEOUS) ×3 IMPLANT
CLAMP CORD UMBIL (MISCELLANEOUS) IMPLANT
CLOSURE WOUND 1/2 X4 (GAUZE/BANDAGES/DRESSINGS) ×1
CLOSURE WOUND 1/4X4 (GAUZE/BANDAGES/DRESSINGS) ×1
CLOTH BEACON ORANGE TIMEOUT ST (SAFETY) ×3 IMPLANT
DRSG OPSITE POSTOP 4X10 (GAUZE/BANDAGES/DRESSINGS) ×3 IMPLANT
ELECT REM PT RETURN 9FT ADLT (ELECTROSURGICAL) ×3
ELECTRODE REM PT RTRN 9FT ADLT (ELECTROSURGICAL) ×1 IMPLANT
EXTRACTOR VACUUM M CUP 4 TUBE (SUCTIONS) IMPLANT
EXTRACTOR VACUUM M CUP 4' TUBE (SUCTIONS)
GLOVE BIOGEL PI IND STRL 7.0 (GLOVE) ×2 IMPLANT
GLOVE BIOGEL PI INDICATOR 7.0 (GLOVE) ×4
GLOVE ECLIPSE 7.0 STRL STRAW (GLOVE) ×6 IMPLANT
GOWN STRL REUS W/TWL LRG LVL3 (GOWN DISPOSABLE) ×6 IMPLANT
HEMOSTAT ARISTA ABSORB 3G PWDR (HEMOSTASIS) ×2 IMPLANT
KIT ABG SYR 3ML LUER SLIP (SYRINGE) IMPLANT
NDL HYPO 25X5/8 SAFETYGLIDE (NEEDLE) IMPLANT
NEEDLE HYPO 22GX1.5 SAFETY (NEEDLE) ×3 IMPLANT
NEEDLE HYPO 25X5/8 SAFETYGLIDE (NEEDLE) IMPLANT
NS IRRIG 1000ML POUR BTL (IV SOLUTION) ×3 IMPLANT
PACK C SECTION WH (CUSTOM PROCEDURE TRAY) ×3 IMPLANT
PAD ABD 7.5X8 STRL (GAUZE/BANDAGES/DRESSINGS) ×3 IMPLANT
PAD OB MATERNITY 4.3X12.25 (PERSONAL CARE ITEMS) ×3 IMPLANT
PENCIL SMOKE EVAC W/HOLSTER (ELECTROSURGICAL) ×3 IMPLANT
RTRCTR C-SECT PINK 25CM LRG (MISCELLANEOUS) ×3 IMPLANT
SPONGE GAUZE 4X4 12PLY STER LF (GAUZE/BANDAGES/DRESSINGS) ×3 IMPLANT
STRIP CLOSURE SKIN 1/2X4 (GAUZE/BANDAGES/DRESSINGS) ×2 IMPLANT
STRIP CLOSURE SKIN 1/4X4 (GAUZE/BANDAGES/DRESSINGS) ×1 IMPLANT
SUT VIC AB 0 CTX 36 (SUTURE) ×6
SUT VIC AB 0 CTX36XBRD ANBCTRL (SUTURE) ×3 IMPLANT
SUT VIC AB 4-0 KS 27 (SUTURE) ×3 IMPLANT
SYR 30ML LL (SYRINGE) ×3 IMPLANT
TOWEL OR 17X24 6PK STRL BLUE (TOWEL DISPOSABLE) ×3 IMPLANT
TRAY FOLEY W/BAG SLVR 14FR LF (SET/KITS/TRAYS/PACK) ×3 IMPLANT
WATER STERILE IRR 1000ML POUR (IV SOLUTION) ×3 IMPLANT

## 2018-08-19 NOTE — Transfer of Care (Signed)
Immediate Anesthesia Transfer of Care Note  Patient: Marie Wade  Procedure(s) Performed: CESAREAN SECTION (N/A )  Patient Location: PACU  Anesthesia Type:Spinal  Level of Consciousness: awake, alert  and oriented  Airway & Oxygen Therapy: Patient Spontanous Breathing  Post-op Assessment: Report given to RN and Post -op Vital signs reviewed and stable  Post vital signs: Reviewed and stable  Last Vitals:  Vitals Value Taken Time  BP 84/38 08/19/18 0201  Temp 36.9 C 08/19/18 0200  Pulse 78 08/19/18 0201  Resp    SpO2 100 % 08/19/18 0201  Vitals shown include unvalidated device data.  Last Pain:  Vitals:   08/19/18 0200  TempSrc: Oral  PainSc:          Complications: No apparent anesthesia complications

## 2018-08-19 NOTE — Op Note (Signed)
Preoperative Diagnosis:  IUP @ [redacted]w[redacted]d, transverse lie in labor  Postoperative Diagnosis:  Same  Procedure:  primary low transverse cesarean section  Surgeon: Darron Doom, M.D.  Assistant: None  Findings: Viable female infant, APGAR (1 MIN): 8   APGAR (5 MINS): 9, transverse lie with arm, chest presenting presentation  Estimated blood loss: 2542 cc  Complications: None known  Specimens: Placenta to labor and delivery  Reason for procedure: Briefly, the patient is a 28 y.o. [id number] [redacted]w[redacted]d who presents for IOL due to h/o PE and on lovenox. IOL began with cytotec and foley and pitocin and AROM. At 7 cm, small parts noted and she was transverse. No ECV possible to OR for delivery.  Procedure: Patient is a to the OR where spinal analgesia was administered. She was then placed in a supine position with left lateral tilt. She received 2 g of Ancef and 500 mg Azithromycin and SCDs were in place. A timeout was performed. She was prepped and draped in the usual sterile fashion. A Foley catheter was placed in the bladder. A knife was then used to make a Pfannenstiel incision. This incision was carried out to underlying fascia which was divided in the midline with the knife. The incision was extended laterally, sharply.  The rectus was divided in the midline.  The peritoneal cavity was entered bluntly.  Alexis retractor was placed inside the incision. A knife was used to make a low transverse incision on the uterus. This incision was carried down to the amniotic cavity was entered. Fetus was in transverse presentation and was brought up out of the incision after internal podalic version and delivered breech without difficulty. Delayed cord clamping. Cord was clamped x 2 and cut. Infant taken to waiting nurse.  Cord blood was obtained. Placenta was delivered from the uterus.  Uterus was cleaned with dry lap pads. Uterine incision closed with 0 Vicryl suture in a locked running fashion. A second layer of 0  Vicryl in an imbricating fashion was used to achieve hemostasis. One figure of eight at midline for hemostasis. Arista to cover incision. Alexis retractor was removed from the abdomen. Peritoneal closure was done with 0 Vicryl suture.  Fascia is closed with 0 Vicryl suture in a running fashion. Subcutaneous tissue infused with 30cc 0.25% Marcaine. Skin closed using 3-0 Vicryl on a Keith needle.  Steri strips applied, followed by pressure dressing.  All instrument, needle and lap counts were correct x 2.  Patient was awake and taken to PACU stable.  Infant remained with mom in couplet care, stable.   Standley Dakins PrattMD 08/19/2018 1:50 AM

## 2018-08-19 NOTE — Lactation Note (Signed)
This note was copied from a baby's chart. Lactation Consultation Note  Patient Name: Marie Wade LOVFI'E Date: 08/19/2018 Reason for consult: Initial assessment;Term P3.  Mom states she breastfed her first babies for 3 weeks.  She hopes to breastfeed newborn longer.  Infant is 25 hours old and has been to breast 4 times.  Mom states baby latches with ease.  Baby is currently sleeping in mom's arms.  Discussed first 24 hour behavior and cluster feeding on day 2-3.  Instructed to feed with any cue and call for assist prn.  Breastfeeding consultation services and support information given and reviewed.  Maternal Data Does the patient have breastfeeding experience prior to this delivery?: Yes  Feeding Feeding Type: Breast Fed  LATCH Score                   Interventions    Lactation Tools Discussed/Used     Consult Status Consult Status: Follow-up Date: 08/20/18 Follow-up type: In-patient    Ave Filter 08/19/2018, 2:23 PM

## 2018-08-19 NOTE — Progress Notes (Signed)
Patient ID: Marie Wade, female   DOB: 12-13-1990, 28 y.o.   MRN: [id number] Asked to see patient due to non-vertex. Appears to be arm presenting. Attempt at ECV unsuccessful. Will move to OR due to transverse lie with arm presenting. FHR over cervix. Possible classical C-section needed.

## 2018-08-19 NOTE — Discharge Summary (Signed)
Postpartum Discharge Summary     Patient Name: Marie Wade DOB: Jan 08, 1991 MRN: [id number]  Date of admission: 08/18/2018 Delivering Provider: Donnamae Jude   Date of discharge: 08/21/2018  Admitting diagnosis: pregnancy Intrauterine pregnancy: [redacted]w[redacted]d     Secondary diagnosis:  Principal Problem:   S/P cesarean section Active Problems:   DVT (deep vein thrombosis) in pregnancy  Additional problems: Transverse lie in labor     Discharge diagnosis: Term Pregnancy Delivered                                                                                                Post partum procedures:None  Augmentation: AROM, Pitocin, Cytotec and Foley Balloon  Complications: None  Hospital course:  Induction of Labor With Cesarean Section  28 y.o. yo [id number] at [redacted]w[redacted]d was admitted to the hospital 08/18/2018 for induction of labor. Patient had a labor course significant for ripening with Cytotec and foley balloon, followed by AROM. The patient went for cesarean section due to Malpresentation, and delivered a Viable infant,08/19/2018  Membrane Rupture Time/Date: 9:40 PM ,08/18/2018   Details of operation can be found in separate operative Note.  Patient had an uncomplicated postpartum course. Lovenox resumed postpartum. She is ambulating, tolerating a regular diet, passing flatus, and urinating well.  Patient is discharged home in stable condition on 08/21/18.                                    Magnesium Sulfate recieved: No BMZ received: No  Physical exam  Vitals:   08/20/18 1527 08/20/18 2024 08/20/18 2300 08/21/18 0622  BP:  108/68  115/75  Pulse:  96  90  Resp:  18  18  Temp: 98.8 F (37.1 C) 100.2 F (37.9 C) 99.2 F (37.3 C) 99.3 F (37.4 C)  TempSrc: Oral Oral Oral Oral  SpO2:  100%  98%  Weight:      Height:       General: alert, cooperative and no distress  Chest: Lungs CTA, Heart RRR Abdomen: Soft, Appropriately Tender, Non Distended, SS x 4 Lochia:  appropriate Uterine Fundus: firm Incision: Dressing is clean, dry, and intact DVT Evaluation: No evidence of DVT seen on physical exam. No significant calf/ankle edema.  Labs: Lab Results  Component Value Date   WBC 11.8 (H) 08/20/2018   HGB 6.5 (LL) 08/20/2018   HCT 19.7 (L) 08/20/2018   MCV 87.2 08/20/2018   PLT 114 (L) 08/20/2018   CMP Latest Ref Rng & Units 08/19/2018  Glucose 70 - 99 mg/dL -  BUN 6 - 20 mg/dL -  Creatinine 0.44 - 1.00 mg/dL 0.60  Sodium 135 - 145 mmol/L -  Potassium 3.5 - 5.1 mmol/L -  Chloride 98 - 111 mmol/L -  CO2 22 - 32 mmol/L -  Calcium 8.9 - 10.3 mg/dL -  Total Protein 6.5 - 8.1 g/dL -  Total Bilirubin 0.3 - 1.2 mg/dL -  Alkaline Phos 38 - 126 U/L -  AST 15 - 41 U/L -  ALT 0 -  44 U/L -    Discharge instruction: per After Visit Summary and "Baby and Me Booklet". Pain Management, Peri-Care, Breastfeeding, Who and When to call for postpartum complications. Information Sheet(s) given Postpartum Baby Blues and Depression    After visit meds:  Allergies as of 08/21/2018      Reactions   Watermelon [citrullus Vulgaris] Itching      Medication List    TAKE these medications   enoxaparin 40 MG/0.4ML injection Commonly known as: LOVENOX Inject 0.4 mLs (40 mg total) into the skin every 12 (twelve) hours.   ferrous sulfate 325 (65 FE) MG tablet Take 1 tablet (325 mg total) by mouth 3 (three) times daily with meals. For 14 days, then once daily for 28 days. What changed:   when to take this  additional instructions   ibuprofen 600 MG tablet Commonly known as: ADVIL Take 1 tablet (600 mg total) by mouth every 6 (six) hours.   oxyCODONE 5 MG immediate release tablet Commonly known as: Oxy IR/ROXICODONE Take 1-2 tablets (5-10 mg total) by mouth every 6 (six) hours as needed for moderate pain.   prenatal vitamin w/FE, FA 27-1 MG Tabs tablet Take 1 tablet by mouth daily at 12 noon.   senna-docusate 8.6-50 MG tablet Commonly known as:  Senokot-S Take 2 tablets by mouth at bedtime.       Diet: routine diet  Activity: Advance as tolerated. Pelvic rest for 6 weeks.   Outpatient follow up:4 weeks Follow up Appt:No future appointments. Follow up Visit:   Please schedule this patient for Postpartum visit in: 4 weeks with the following provider: MD For C/S patients schedule nurse incision check in weeks 2 weeks: yes High risk pregnancy complicated by: Bleeding disorder on Lovenox 40mg  BID x 6 weeks Delivery mode:  CS Anticipated Birth Control:  IUD PP Procedures needed: Incision check  Schedule Integrated BH visit: no    Newborn Data: Live born female  Birth Weight:   APGAR: 8, 9  Newborn Delivery   Birth date/time: 08/19/2018 00:52:00 Delivery type: C-Section, Low Transverse C-section categorization: Primary      Baby Feeding: Breast Disposition:home with mother   08/21/2018 Cherre RobinsJessica L Jeorgia Helming, CNM

## 2018-08-19 NOTE — Anesthesia Procedure Notes (Signed)
Spinal  Patient location during procedure: OR Start time: 08/19/2018 12:30 AM End time: 08/19/2018 12:35 AM Staffing Anesthesiologist: Lynda Rainwater, MD Performed: anesthesiologist  Preanesthetic Checklist Completed: patient identified, site marked, surgical consent, pre-op evaluation, timeout performed, IV checked, risks and benefits discussed and monitors and equipment checked Spinal Block Patient position: sitting Prep: site prepped and draped and DuraPrep Patient monitoring: heart rate, cardiac monitor, continuous pulse ox and blood pressure Approach: midline Location: L3-4 Injection technique: single-shot Needle Needle type: Sprotte  Needle gauge: 24 G Needle length: 9 cm Assessment Sensory level: T4

## 2018-08-19 NOTE — Anesthesia Postprocedure Evaluation (Signed)
Anesthesia Post Note  Patient: Marie Wade  Procedure(s) Performed: CESAREAN SECTION (N/A )     Patient location during evaluation: PACU Anesthesia Type: Spinal Level of consciousness: oriented and awake and alert Pain management: pain level controlled Vital Signs Assessment: post-procedure vital signs reviewed and stable Respiratory status: spontaneous breathing and respiratory function stable Cardiovascular status: blood pressure returned to baseline and stable Postop Assessment: no headache, no backache and no apparent nausea or vomiting Anesthetic complications: no    Last Vitals:  Vitals:   08/19/18 0315 08/19/18 0415  BP: (!) 100/58 96/60  Pulse: 74 75  Resp: 16 18  Temp: 36.8 C 36.8 C  SpO2: 99% 98%    Last Pain:  Vitals:   08/19/18 0415  TempSrc: Oral  PainSc: 2    Pain Goal:                Epidural/Spinal Function Cutaneous sensation: Normal sensation (08/19/18 0415)  Lynda Rainwater

## 2018-08-19 NOTE — Anesthesia Preprocedure Evaluation (Addendum)
Anesthesia Evaluation  Patient identified by MRN, date of birth, ID band Patient awake    Reviewed: Allergy & Precautions, NPO status , Patient's Chart, lab work & pertinent test results  Airway Mallampati: II  TM Distance: >3 FB Neck ROM: Full    Dental no notable dental hx.    Pulmonary neg pulmonary ROS,    Pulmonary exam normal breath sounds clear to auscultation       Cardiovascular negative cardio ROS Normal cardiovascular exam Rhythm:Regular Rate:Normal     Neuro/Psych negative neurological ROS  negative psych ROS   GI/Hepatic negative GI ROS, Neg liver ROS,   Endo/Other  negative endocrine ROS  Renal/GU negative Renal ROS  negative genitourinary   Musculoskeletal negative musculoskeletal ROS (+)   Abdominal   Peds negative pediatric ROS (+)  Hematology negative hematology ROS (+)   Anesthesia Other Findings   Reproductive/Obstetrics (+) Pregnancy                             Anesthesia Physical Anesthesia Plan  ASA: II and emergent  Anesthesia Plan: Spinal   Post-op Pain Management:    Induction: Intravenous  PONV Risk Score and Plan: 3 and Ondansetron, Dexamethasone, Midazolam and Treatment may vary due to age or medical condition  Airway Management Planned: Natural Airway  Additional Equipment:   Intra-op Plan:   Post-operative Plan: Extubation in OR  Informed Consent: I have reviewed the patients History and Physical, chart, labs and discussed the procedure including the risks, benefits and alternatives for the proposed anesthesia with the patient or authorized representative who has indicated his/her understanding and acceptance.     Dental advisory given  Plan Discussed with: CRNA  Anesthesia Plan Comments:        Anesthesia Quick Evaluation

## 2018-08-20 ENCOUNTER — Encounter (HOSPITAL_COMMUNITY): Payer: Self-pay | Admitting: *Deleted

## 2018-08-20 LAB — CBC
HCT: 19.7 % — ABNORMAL LOW (ref 36.0–46.0)
Hemoglobin: 6.5 g/dL — CL (ref 12.0–15.0)
MCH: 28.8 pg (ref 26.0–34.0)
MCHC: 33 g/dL (ref 30.0–36.0)
MCV: 87.2 fL (ref 80.0–100.0)
Platelets: 94 10*3/uL — ABNORMAL LOW (ref 150–400)
RBC: 2.26 MIL/uL — ABNORMAL LOW (ref 3.87–5.11)
RDW: 13.7 % (ref 11.5–15.5)
WBC: 11.8 10*3/uL — ABNORMAL HIGH (ref 4.0–10.5)
nRBC: 0 % (ref 0.0–0.2)

## 2018-08-20 LAB — SAVE SMEAR(SSMR), FOR PROVIDER SLIDE REVIEW

## 2018-08-20 LAB — PROTIME-INR
INR: 1.1 (ref 0.8–1.2)
Prothrombin Time: 14.1 seconds (ref 11.4–15.2)

## 2018-08-20 LAB — FIBRINOGEN: Fibrinogen: 384 mg/dL (ref 210–475)

## 2018-08-20 LAB — APTT: aPTT: 32 seconds (ref 24–36)

## 2018-08-20 LAB — PLATELET COUNT: Platelets: 114 10*3/uL — ABNORMAL LOW (ref 150–400)

## 2018-08-20 NOTE — Progress Notes (Signed)
Burnett Lieber [id number] Postpartum Day 1 S/P Primary Cesarean Section due to Fetal Malpresentation-Transverse Lie  Subjective: Patient up ad lib, denies syncope or dizziness. Reports consuming regular diet without issues and denies N/V. Patient reports no bowel movement, but is passing flatus.  Denies issues with urination and reports bleeding is "okay."  Patient is breastfeeding and reports going well.  Desires Paraguard for postpartum contraception.  Pain is being appropriately managed with use of Ibuprofen.   Objective: Temp:  [97.5 F (36.4 C)-98.6 F (37 C)] 98.3 F (36.8 C) (07/18 0616) Pulse Rate:  [59-86] 83 (07/18 0616) Resp:  [17-18] 17 (07/18 0616) BP: (90-110)/(55-61) 93/61 (07/18 0616) SpO2:  [98 %-100 %] 100 % (07/18 0616)  Recent Labs    08/18/18 0050 08/19/18 0603 08/20/18 0547  HGB 12.5 7.7* 6.5*  HCT 38.6 23.8* 19.7*  WBC 8.3 12.1* 11.8*    Physical Exam:  General: alert, cooperative and no distress Mood/Affect: Appropriate/Congruent Lungs: clear to auscultation, no wheezes, rales or rhonchi, symmetric air entry.  Heart: normal rate and regular rhythm. Breast: breasts appear normal, no suspicious masses, no skin or nipple changes.  Soft, Filling Abdomen:  + bowel sounds, Mild Tenderness Incision: healing well, Pressure Dressing removed, Honneycomb dressing intact Uterine Fundus: firm at umbilicus, Displaced to Right Lochia: appropriate Skin: Warm, Dry. DVT Evaluation: No evidence of DVT seen on physical exam. No significant calf/ankle edema. JP drain:   None  Assessment Post Operative Day 1 S/P Primary C/S Normal Involution Involution BreastFeeding Asymptomatic Anemia  Plan: -Extensive discussion regarding need for additional labs due to previous lab results. -Educated on platelets and their function, HgB levels and s/s of Anemia. -Discussed recommendation for blood transfusion if HgB continues to decline or of symptoms emerge.  -Patient  expresses gratitude and understanding regarding education. -Given some assistance with latch.  Encouraged to continue breastfeeding despite shortcomings.  -No other questions or concerns. -Encouraged to ambulate today and report any symptoms of anemia. -Orthostatic Vital Signs -Continue Fe+ supplementation BID -Informed that if ambulating without issues and labs remain stable, okay for shower this evening. -Informed that if labs remain stable throughout the night, will consider discharge tomorrow. -Continue other mgmt as ordered   Maryann Conners MSN, CNM 08/20/2018, 9:08 AM

## 2018-08-20 NOTE — Progress Notes (Signed)
CRITICAL VALUE ALERT  Critical Value:  6.5 HGB  Date & Time Notied:  0632 08/20/18  Provider Notified: Autry-Lott  Orders Received/Actions taken: No orders at this time.

## 2018-08-21 DIAGNOSIS — Z98891 History of uterine scar from previous surgery: Secondary | ICD-10-CM

## 2018-08-21 HISTORY — DX: History of uterine scar from previous surgery: Z98.891

## 2018-08-21 MED ORDER — OXYCODONE HCL 5 MG PO TABS: 5.0000 mg | ORAL_TABLET | Freq: Four times a day (QID) | ORAL | 0 refills | Status: DC | PRN

## 2018-08-21 MED ORDER — SENNOSIDES-DOCUSATE SODIUM 8.6-50 MG PO TABS: 2.0000 | ORAL_TABLET | Freq: Every day | ORAL | 1 refills | Status: DC

## 2018-08-21 MED ORDER — IBUPROFEN 600 MG PO TABS: 600.0000 mg | ORAL_TABLET | Freq: Four times a day (QID) | ORAL | 0 refills | Status: DC

## 2018-08-21 MED ORDER — ENOXAPARIN SODIUM 40 MG/0.4ML ~~LOC~~ SOLN: 40.0000 mg | Freq: Two times a day (BID) | SUBCUTANEOUS | 0 refills | Status: DC

## 2018-08-21 MED ORDER — FERROUS SULFATE 325 (65 FE) MG PO TABS: 325.0000 mg | ORAL_TABLET | Freq: Three times a day (TID) | ORAL | 3 refills | Status: DC

## 2018-08-21 NOTE — Progress Notes (Signed)
Marie Wade [id number] Postpartum Day 2 S/P Primary Cesarean Section due to Fetal Malpresentation  Subjective: Patient up ad lib, denies syncope or dizziness. Reports consuming regular diet without issues and denies N/V. Patient reports no bowel movement, but continues to pass flatus.  Denies issues with urination and reports bleeding is "good and I haven't been seeing much blood."  Patient is breastfeeding and reports going well.  Desires Paraguard for postpartum contraception.  Pain is being appropriately managed with use of Ibuprofen.   Objective: Temp:  [98.2 F (36.8 C)-100.2 F (37.9 C)] 99.3 F (37.4 C) (07/19 0622) Pulse Rate:  [78-96] 90 (07/19 0622) Resp:  [18] 18 (07/19 0622) BP: (100-115)/(60-75) 115/75 (07/19 0622) SpO2:  [98 %-100 %] 98 % (07/19 0622)  Recent Labs    08/19/18 0603 08/20/18 0547  HGB 7.7* 6.5*  HCT 23.8* 19.7*  WBC 12.1* 11.8*    Physical Exam:  General: alert, cooperative and no distress Mood/Affect: Appropriate/Appropriate Lungs: clear to auscultation, no wheezes, rales or rhonchi, symmetric air entry.  Heart: normal rate and regular rhythm. Breast: breasts appear normal, no suspicious masses, no skin or nipple changes. Filling Abdomen:  + bowel sounds, Non Tender Incision: No additional significant drainage noted on Honeycomb dressing  Uterine Fundus: firm, U/-2 Lochia: appropriate Skin: Warm, Dry. DVT Evaluation: No evidence of DVT seen on physical exam. No significant calf/ankle edema. JP drain:   None  Assessment Post Operative Day 2 S/P Primary C/S Normal Involution BreastFeeding Hemodynamically Stable  Plan: -Desires discharge today, but peds has yet to see baby -Dressing change today -Discharge instructions given -Informed of need for incision check in one week -Desires Paragard, will place at 4 week PPV -Continue other mgmt as ordered -Will update labor team  Maryann Conners MSN, CNM 08/21/2018, 9:17 AM

## 2018-08-21 NOTE — Discharge Instructions (Signed)
Postpartum Care After Cesarean Delivery °This sheet gives you information about how to care for yourself from the time you deliver your baby to up to 6-12 weeks after delivery (postpartum period). Your health care provider may also give you more specific instructions. If you have problems or questions, contact your health care provider. °Follow these instructions at home: °Medicines °· Take over-the-counter and prescription medicines only as told by your health care provider. °· If you were prescribed an antibiotic medicine, take it as told by your health care provider. Do not stop taking the antibiotic even if you start to feel better. °· Ask your health care provider if the medicine prescribed to you: °? Requires you to avoid driving or using heavy machinery. °? Can cause constipation. You may need to take actions to prevent or treat constipation, such as: °§ Drink enough fluid to keep your urine pale yellow. °§ Take over-the-counter or prescription medicines. °§ Eat foods that are high in fiber, such as beans, whole grains, and fresh fruits and vegetables. °§ Limit foods that are high in fat and processed sugars, such as fried or sweet foods. °Activity °· Gradually return to your normal activities as told by your health care provider. °· Avoid activities that take a lot of effort and energy (are strenuous) until approved by your health care provider. Walking at a slow to moderate pace is usually safe. Ask your health care provider what activities are safe for you. °? Do not lift anything that is heavier than your baby or 10 lb (4.5 kg) as told by your health care provider. °? Do not vacuum, climb stairs, or drive a car for as long as told by your health care provider. °· If possible, have someone help you at home until you are able to do your usual activities yourself. °· Rest as much as possible. Try to rest or take naps while your baby is sleeping. °Vaginal bleeding °· It is normal to have vaginal bleeding  (lochia) after delivery. Wear a sanitary pad to absorb vaginal bleeding and discharge. °? During the first week after delivery, the amount and appearance of lochia is often similar to a menstrual period. °? Over the next few weeks, it will gradually decrease to a dry, yellow-[place] discharge. °? For most women, lochia stops completely by 4-6 weeks after delivery. Vaginal bleeding can vary from woman to woman. °· Change your sanitary pads frequently. Watch for any changes in your flow, such as: °? A sudden increase in volume. °? A change in color. °? Large blood clots. °· If you pass a blood clot, save it and call your health care provider to discuss. Do not flush blood clots down the toilet before you get instructions from your health care provider. °· Do not use tampons or douches until your health care provider says this is safe. °· If you are not breastfeeding, your period should return 6-8 weeks after delivery. If you are breastfeeding, your period may return anytime between 8 weeks after delivery and the time that you stop breastfeeding. °Perineal care ° °· If your C-section (Cesarean section) was unplanned, and you were allowed to labor and push before delivery, you may have pain, swelling, and discomfort of the tissue between your vaginal opening and your anus (perineum). You may also have an incision in the tissue (episiotomy) or the tissue may have torn during delivery. Follow these instructions as told by your health care provider: °? Keep your perineum clean and dry as told by   your health care provider. Use medicated pads and pain-relieving sprays and creams as directed. °? If you have an episiotomy or vaginal tear, check the area every day for signs of infection. Check for: °§ Redness, swelling, or pain. °§ Fluid or blood. °§ Warmth. °§ Pus or a bad smell. °? You may be given a squirt bottle to use instead of wiping to clean the perineum area after you go to the bathroom. As you start healing, you may use  the squirt bottle before wiping yourself. Make sure to wipe gently. °? To relieve pain caused by an episiotomy, vaginal tear, or hemorrhoids, try taking a warm sitz bath 2-3 times a day. A sitz bath is a warm water bath that is taken while you are sitting down. The water should only come up to your hips and should cover your buttocks. °Breast care °· Within the first few days after delivery, your breasts may feel heavy, full, and uncomfortable (breast engorgement). You may also have milk leaking from your breasts. Your health care provider can suggest ways to help relieve breast discomfort. Breast engorgement should go away within a few days. °· If you are breastfeeding: °? Wear a bra that supports your breasts and fits you well. °? Keep your nipples clean and dry. Apply creams and ointments as told by your health care provider. °? You may need to use breast pads to absorb milk leakage. °? You may have uterine contractions every time you breastfeed for several weeks after delivery. Uterine contractions help your uterus return to its normal size. °? If you have any problems with breastfeeding, work with your health care provider or a lactation consultant. °· If you are not breastfeeding: °? Avoid touching your breasts as this can make your breasts produce more milk. °? Wear a well-fitting bra and use cold packs to help with swelling. °? Do not squeeze out (express) milk. This causes you to make more milk. °Intimacy and sexuality °· Ask your health care provider when you can engage in sexual activity. This may depend on your: °? Risk of infection. °? Healing rate. °? Comfort and desire to engage in sexual activity. °· You are able to get pregnant after delivery, even if you have not had your period. If desired, talk with your health care provider about methods of family planning or birth control (contraception). °Lifestyle °· Do not use any products that contain nicotine or tobacco, such as cigarettes, e-cigarettes,  and chewing tobacco. If you need help quitting, ask your health care provider. °· Do not drink alcohol, especially if you are breastfeeding. °Eating and drinking ° °· Drink enough fluid to keep your urine pale yellow. °· Eat high-fiber foods every day. These may help prevent or relieve constipation. High-fiber foods include: °? Whole grain cereals and breads. °? [place] rice. °? Beans. °? Fresh fruits and vegetables. °· Take your prenatal vitamins until your postpartum checkup or until your health care provider tells you it is okay to stop. °General instructions °· Keep all follow-up visits for you and your baby as told by your health care provider. Most women visit their health care provider for a postpartum checkup within the first 3-6 weeks after delivery. °Contact a health care provider if you: °· Feel unable to cope with the changes that a new baby brings to your life, and these feelings do not go away. °· Feel unusually sad or worried. °· Have breasts that are painful, hard, or turn red. °· Have a fever. °·   Have trouble holding urine or keeping urine from leaking. °· Have little or no interest in activities you used to enjoy. °· Have not breastfed at all and you have not had a menstrual period for 12 weeks after delivery. °· Have stopped breastfeeding and you have not had a menstrual period for 12 weeks after you stopped breastfeeding. °· Have questions about caring for yourself or your baby. °· Pass a blood clot from your vagina. °Get help right away if you: °· Have chest pain. °· Have difficulty breathing. °· Have sudden, severe leg pain. °· Have severe pain or cramping in your abdomen. °· Bleed from your vagina so much that you fill more than one sanitary pad in one hour. Bleeding should not be heavier than your heaviest period. °· Develop a severe headache. °· Faint. °· Have blurred vision or spots in your vision. °· Have a bad-smelling vaginal discharge. °· Have thoughts about hurting yourself or your  baby. °If you ever feel like you may hurt yourself or others, or have thoughts about taking your own life, get help right away. You can go to your nearest emergency department or call: °· Your local emergency services (911 in the [place] °· A suicide crisis helpline, such as the National Suicide Prevention Lifeline at 1-800-273-8255. This is open 24 hours a day. °Summary °· The period of time from when you deliver your baby to up to 6-12 weeks after delivery is called the postpartum period. °· Gradually return to your normal activities as told by your health care provider. °· Keep all follow-up visits for you and your baby as told by your health care provider. °This information is not intended to replace advice given to you by your health care provider. Make sure you discuss any questions you have with your health care provider. °Document Released: 01/17/2000 Document Revised: 09/08/2017 Document Reviewed: 09/08/2017 °Elsevier Patient Education © 2020 [employer]. ° °

## 2018-08-31 ENCOUNTER — Ambulatory Visit (INDEPENDENT_AMBULATORY_CARE_PROVIDER_SITE_OTHER): Payer: Medicaid Other

## 2018-08-31 ENCOUNTER — Other Ambulatory Visit: Payer: Self-pay

## 2018-08-31 VITALS — BP 118/73 | HR 69 | Wt 171.0 lb

## 2018-08-31 DIAGNOSIS — Z5189 Encounter for other specified aftercare: Secondary | ICD-10-CM

## 2018-08-31 NOTE — Progress Notes (Signed)
Pt here today for incision check s/p c-section on 08/19/18.  Pt denies any pain.  Pt reports some spotting.  Incision well approximated, no odor, no drainage, and no signs of infection.  Pt advised to continue to watch for infection and that the provider will reevaluate on 09/16/18 for her pp visit with IUD.  Pt verbalized understanding with no further questions.   Mel Almond, RN 08/31/18

## 2018-08-31 NOTE — Progress Notes (Signed)
Chart reviewed for nurse visit. Agree with plan of care.   Ronel Rodeheaver Lorraine, CNM 08/31/2018 1:36 PM   

## 2018-09-07 NOTE — Progress Notes (Signed)
Chart reviewed for nurse visit. Agree with plan of care.   Starr Lake, CNM 09/07/2018 1:58 PM

## 2018-09-16 ENCOUNTER — Ambulatory Visit: Payer: Medicaid Other | Admitting: Medical

## 2018-09-27 ENCOUNTER — Other Ambulatory Visit: Payer: Self-pay

## 2018-09-27 ENCOUNTER — Encounter: Payer: Self-pay | Admitting: Student

## 2018-09-27 ENCOUNTER — Ambulatory Visit (INDEPENDENT_AMBULATORY_CARE_PROVIDER_SITE_OTHER): Payer: Medicaid Other | Admitting: Student

## 2018-09-27 DIAGNOSIS — Z30430 Encounter for insertion of intrauterine contraceptive device: Secondary | ICD-10-CM

## 2018-09-27 DIAGNOSIS — Z1389 Encounter for screening for other disorder: Secondary | ICD-10-CM | POA: Diagnosis not present

## 2018-09-27 MED ORDER — PARAGARD INTRAUTERINE COPPER IU IUD
INTRAUTERINE_SYSTEM | Freq: Once | INTRAUTERINE | Status: AC
  Administered 2018-09-27: 17:00:00 via INTRAUTERINE

## 2018-09-27 NOTE — Progress Notes (Signed)
Post Partum Exam  Marie Wade is a 28 y.o. [id number] female who presents for a postpartum visit. She is 5 wks postpartum following a cesarean delivery. I have fully reviewed the prenatal and intrapartum course. The delivery was at 39w 1d  gestational weeks.  Anesthesia: Spinal. Postpartum course has been uneventful. Baby's course has been uneventful.  Baby is feeding by breast. Bleeding none. Bowel function is normal. Bladder function is normal. Patient is sexually active. Last sex encounter was 3 days ago with a condom.  Contraception method is condoms. Pt desires Paragard IUD.  Postpartum depression screening: negative Last pap smear done 02/11/18 and was normal. Pt states she is not taking Lovenox injections.   Review of Systems Pertinent items are noted in HPI.    Objective:  Blood pressure 113/61, pulse 78, temperature 98 F (36.7 C), height 5\' 8"  (1.727 m), weight 170 lb 12.8 oz (77.5 kg), last menstrual period 11/18/2017, currently breastfeeding.  General:  alert and cooperative   Breasts:  inspection negative, no nipple discharge or bleeding, no masses or nodularity palpable  Lungs: clear to auscultation bilaterally  Heart:  regular rate and rhythm, S1, S2 normal, no murmur, click, rub or gallop  Abdomen: soft, non-tender; bowel sounds normal; no masses,  no organomegaly   Vulva:  normal  Vagina: not evaluated  Cervix:  Normal appearance  Corpus: not examined  Adnexa:  normal adnexa  Rectal Exam: Not performed.        Assessment:    Healthy postpartum exam. Pap smear not done at today's visit.   Plan:   1. Contraception: IUD. See insertion note below. Discussed that paraguard will not interrupt pregnancy, but it can act as emergency contraception. Patient had protected intercourse 3 days ago, but she may want to consider taking a pregnancy test in 2 weeks.  2. Will order hematology consult per Dr. Ilda Basset recommendations.  3. Follow up in: 4 weeks for string check.      [facility] PROCEDURE NOTE  Marie Wade is a 28 y.o. [id number] here for Paraguard IUD insertion. No GYN concerns.  Last pap smear was on 2020 and was normal.  IUD Insertion Procedure Note Patient identified, informed consent performed, consent signed.   Discussed risks of irregular bleeding, cramping, infection, malpositioning or misplacement of the IUD outside the uterus which may require further procedure such as laparoscopy. Time out was performed.  Urine pregnancy test not done.  Speculum placed in the vagina.  Cervix visualized.  Cleaned with Betadine x 2.  Grasped anteriorly with a single tooth tenaculum.  Uterus sounded to 7 cm.  Paraguard IUD placed per manufacturer's recommendations.  Strings trimmed to 3 cm. Tenaculum was removed, good hemostasis noted.  Patient tolerated procedure well.   Patient was given post-procedure instructions.  She was advised to have backup contraception for one week.  Patient was also asked to check IUD strings periodically and follow up in 4 weeks for IUD check.

## 2018-09-28 ENCOUNTER — Telehealth: Payer: Self-pay | Admitting: *Deleted

## 2018-09-28 ENCOUNTER — Other Ambulatory Visit: Payer: Self-pay | Admitting: Student

## 2018-09-28 ENCOUNTER — Telehealth: Payer: Self-pay | Admitting: Hematology and Oncology

## 2018-09-28 ENCOUNTER — Other Ambulatory Visit: Payer: Self-pay | Admitting: *Deleted

## 2018-09-28 DIAGNOSIS — O2230 Deep phlebothrombosis in pregnancy, unspecified trimester: Secondary | ICD-10-CM

## 2018-09-28 NOTE — Telephone Encounter (Signed)
-----   Message from Starr Lake, CNM sent at 09/28/2018  6:35 AM EDT ----- Regarding: patient needs hematology referral Hello! This patient needs a referral to hematology. I don't know exactly which order to place; she doesn't need oncology (it's not related to cancer).  I'm sorry; she was the last patient of the day yesterday and I didn't see the note about this from Dr. Ilda Basset until just now.  Curt Bears

## 2018-09-28 NOTE — Telephone Encounter (Signed)
I placed order for referral and called to schedule. I left message calling for referral appointment and left my number to be called back.  Nikalas Bramel,RN

## 2018-09-28 NOTE — Telephone Encounter (Signed)
Thank you :)

## 2018-09-28 NOTE — Telephone Encounter (Signed)
I scheduled her hematology appointment for 10/18/18 at 1:00. I called [place] and explained to her when the appointment is, where it is, and why she needs it. She denied any questions and voices understanding. Linda,RN

## 2018-09-28 NOTE — Telephone Encounter (Signed)
Received a new hem referrals from Dr. Ilda Basset for DVT. I received a call from [place] at [facility] for [facility] to schedule a hem appt w/Dr. Lindi Adie on 9/15 at [facility] will notify the pt.

## 2018-10-17 NOTE — Progress Notes (Signed)
[facility] CONSULT NOTE  Patient Care Team: Patient, No Pcp Per as PCP - General (General Practice)  CHIEF COMPLAINTS/PURPOSE OF CONSULTATION:  History of DVT during pregnancy   HISTORY OF PRESENTING ILLNESS:  Marie Wade 28 y.o. female is here because of a history of DVT during pregnancy. On 01/30/18 she presented to the ED for left calf pain present for 3 weeks following a road trip to [place]. She was [redacted] weeks pregnant. US showed acute DVT. She was started on Lovenox. She delivered a baby girl on 08/19/18 via cesarean section. She has a family history of blood clots. She presents to the clinic today for initial evaluation.  She reports to me that she was not very compliant with Lovenox throughout the pregnancy.  She in fact stopped Lovenox 1 to 2 weeks prior to delivery.  She did not reinitiate Lovenox therapy even though she was instructed to do so.  During the delivery time she apparently lost significant amount of blood and hemoglobin had dropped down to 6.5.  She has since felt better however has not had a recheck of her CBC.  I reviewed her records extensively and collaborated the history with the patient.  MEDICAL HISTORY:  Past Medical History:  Diagnosis Date  . DVT (deep vein thrombosis) in pregnancy     SURGICAL HISTORY: Past Surgical History:  Procedure Laterality Date  . CESAREAN SECTION N/A 08/19/2018   Procedure: CESAREAN SECTION;  Surgeon: Reva BoresPratt, Tanya S, MD;  Location: [facility] LD ORS;  Service: Obstetrics;  Laterality: N/A;  . DILATION AND CURETTAGE OF UTERUS      SOCIAL HISTORY: Social History   Socioeconomic History  . Marital status: Significant Other    Spouse name: Benedetto Goadsaac Burton  . Number of children: Not on file  . Years of education: Not on file  . Highest education level: Not on file  Occupational History  . Not on file  Social Needs  . Financial resource strain: Not hard at all  . Food insecurity    Worry: Never true    Inability: Never  true  . Transportation needs    Medical: No    Non-medical: No  Tobacco Use  . Smoking status: Never Smoker  . Smokeless tobacco: Never Used  Substance and Sexual Activity  . Alcohol use: Never    Frequency: Never  . Drug use: Never  . Sexual activity: Yes    Birth control/protection: None  Lifestyle  . Physical activity    Days per week: Not on file    Minutes per session: Not on file  . Stress: Not on file  Relationships  . Social Musicianconnections    Talks on phone: Not on file    Gets together: Not on file    Attends religious service: Not on file    Active member of club or organization: Not on file    Attends meetings of clubs or organizations: Not on file    Relationship status: Not on file  . Intimate partner violence    Fear of current or ex partner: No    Emotionally abused: No    Physically abused: No    Forced sexual activity: No  Other Topics Concern  . Not on file  Social History Narrative  . Not on file    FAMILY HISTORY: Family History  Problem Relation Age of Onset  . Cancer Mother   . Hypertension Father   . Diabetes Father   . Heart disease Father  ALLERGIES:  is allergic to watermelon [citrullus vulgaris].  MEDICATIONS:  Current Outpatient Medications  Medication Sig Dispense Refill  . enoxaparin (LOVENOX) 40 MG/0.4ML injection Inject 0.4 mLs (40 mg total) into the skin every 12 (twelve) hours. (Patient not taking: Reported on 08/31/2018) 33.6 mL 0  . ferrous sulfate 325 (65 FE) MG tablet Take 1 tablet (325 mg total) by mouth 3 (three) times daily with meals. For 14 days, then once daily for 28 days. (Patient not taking: Reported on 08/31/2018) 90 tablet 3  . ibuprofen (ADVIL) 600 MG tablet Take 1 tablet (600 mg total) by mouth every 6 (six) hours. (Patient not taking: Reported on 08/31/2018) 30 tablet 0  . oxyCODONE (OXY IR/ROXICODONE) 5 MG immediate release tablet Take 1-2 tablets (5-10 mg total) by mouth every 6 (six) hours as needed for moderate  pain. (Patient not taking: Reported on 08/31/2018) 10 tablet 0  . prenatal vitamin w/FE, FA (PRENATAL 1 + 1) 27-1 MG TABS tablet Take 1 tablet by mouth daily at 12 noon. 30 each 11  . senna-docusate (SENOKOT-S) 8.6-50 MG tablet Take 2 tablets by mouth at bedtime. (Patient not taking: Reported on 08/31/2018) 14 tablet 1   No current facility-administered medications for this visit.     REVIEW OF SYSTEMS:   Constitutional: Denies fevers, chills or abnormal night sweats Eyes: Denies blurriness of vision, double vision or watery eyes Ears, nose, mouth, throat, and face: Denies mucositis or sore throat Respiratory: Denies cough, dyspnea or wheezes Cardiovascular: Denies palpitation, chest discomfort or lower extremity swelling Gastrointestinal:  Denies nausea, heartburn or change in bowel habits Skin: Denies abnormal skin rashes Lymphatics: Denies new lymphadenopathy or easy bruising Neurological:Denies numbness, tingling or new weaknesses Behavioral/Psych: She may have had postpartum depression but appears to have recovered fairly well. Breast: Denies any palpable lumps or discharge All other systems were reviewed with the patient and are negative.  PHYSICAL EXAMINATION: ECOG PERFORMANCE STATUS: 1 - Symptomatic but completely ambulatory  Vitals:   10/18/18 1334  BP: 129/70  Pulse: 74  Resp: 18  Temp: 98.5 F (36.9 C)  SpO2: 98%   Filed Weights   10/18/18 1334  Weight: 169 lb 6.4 oz (76.8 kg)    GENERAL:alert, no distress and comfortable SKIN: skin color, texture, turgor are normal, no rashes or significant lesions EYES: normal, conjunctiva are pink and non-injected, sclera clear OROPHARYNX:no exudate, no erythema and lips, buccal mucosa, and tongue normal  NECK: supple, thyroid normal size, non-tender, without nodularity LYMPH:  no palpable lymphadenopathy in the cervical, axillary or inguinal LUNGS: clear to auscultation and percussion with normal breathing effort HEART:  regular rate & rhythm and no murmurs and no lower extremity edema ABDOMEN:abdomen soft, non-tender and normal bowel sounds Musculoskeletal:no cyanosis of digits and no clubbing  PSYCH: alert & oriented x 3 with fluent speech, slight underlying postpartum depression symptoms noted NEURO: no focal motor/sensory deficits   LABORATORY DATA:  I have reviewed the data as listed Lab Results  Component Value Date   WBC 11.8 (H) 08/20/2018   HGB 6.5 (LL) 08/20/2018   HCT 19.7 (L) 08/20/2018   MCV 87.2 08/20/2018   PLT 114 (L) 08/20/2018   Lab Results  Component Value Date   NA 134 (L) 01/30/2018   K 3.9 01/30/2018   CL 103 01/30/2018   CO2 23 01/30/2018    RADIOGRAPHIC STUDIES: I have personally reviewed the radiological reports and agreed with the findings in the report.  ASSESSMENT AND PLAN:  DVT (deep vein  thrombosis) in pregnancy 02/01/2018: Left leg DVT involving left popliteal vein, left posterior tibial vein, left peroneal vein, left soleal vein and left gastrocnemius vein Treatment summary: Lovenox throughout pregnancy July 2020: Normal delivery  I discussed with the patient risk factors for blood clots.  Inherited risk factors include: 1. Factor V Leiden mutation 2. Prothrombin gene G20210A 3. Protein S deficiency  4. Protein C deficiency  5. Antithrombin deficiency  Acquired risk factors include: 1. Antiphospholipid antibody syndrome: Being checked today 2. Tobacco use: Patient does not smoke 3. Obesity: Patient is very fit 4. Medications including oral contraceptives: Patient does not take 5. Sedentary behavior including postoperative state: Patient was long distance drive to [place] before the blood clot happened 6. Foreign bodies in circulation: None  Workup recommended: Bloodwork to evaluate for the 5 inherited factors mentioned above along with antiphospholipid antibodies.  Severe anemia during peripartum: Hemoglobin had dropped down to 6.5 which was  previously normal.  We will recheck the CBC today.   Patient has discontinued anticoagulation 1-2 weeks prior to delivery. Return to clinic through a Doximity video visit in one to 2 weeks to discuss the results of the tests    All questions were answered. The patient knows to call the clinic with any problems, questions or concerns.   Rulon Eisenmenger, MD 10/18/2018    I, Molly Dorshimer, am acting as scribe for Nicholas Lose, MD.  I have reviewed the above documentation for accuracy and completeness, and I agree with the above.

## 2018-10-18 ENCOUNTER — Other Ambulatory Visit: Payer: Self-pay

## 2018-10-18 ENCOUNTER — Inpatient Hospital Stay: Payer: Medicaid Other | Attending: Hematology and Oncology | Admitting: Hematology and Oncology

## 2018-10-18 ENCOUNTER — Inpatient Hospital Stay: Payer: Medicaid Other

## 2018-10-18 DIAGNOSIS — Z7901 Long term (current) use of anticoagulants: Secondary | ICD-10-CM | POA: Diagnosis not present

## 2018-10-18 DIAGNOSIS — O2230 Deep phlebothrombosis in pregnancy, unspecified trimester: Secondary | ICD-10-CM

## 2018-10-18 DIAGNOSIS — Z79899 Other long term (current) drug therapy: Secondary | ICD-10-CM | POA: Diagnosis not present

## 2018-10-18 DIAGNOSIS — E669 Obesity, unspecified: Secondary | ICD-10-CM | POA: Diagnosis not present

## 2018-10-18 DIAGNOSIS — D649 Anemia, unspecified: Secondary | ICD-10-CM | POA: Diagnosis not present

## 2018-10-18 DIAGNOSIS — Z791 Long term (current) use of non-steroidal anti-inflammatories (NSAID): Secondary | ICD-10-CM | POA: Diagnosis not present

## 2018-10-18 DIAGNOSIS — Z86718 Personal history of other venous thrombosis and embolism: Secondary | ICD-10-CM | POA: Insufficient documentation

## 2018-10-18 DIAGNOSIS — D6851 Activated protein C resistance: Secondary | ICD-10-CM | POA: Diagnosis not present

## 2018-10-18 DIAGNOSIS — D6859 Other primary thrombophilia: Secondary | ICD-10-CM

## 2018-10-18 LAB — CBC WITH DIFFERENTIAL (CANCER CENTER ONLY)
Abs Immature Granulocytes: 0.01 10*3/uL (ref 0.00–0.07)
Basophils Absolute: 0 10*3/uL (ref 0.0–0.1)
Basophils Relative: 1 %
Eosinophils Absolute: 0.1 10*3/uL (ref 0.0–0.5)
Eosinophils Relative: 2 %
HCT: 38.4 % (ref 36.0–46.0)
Hemoglobin: 12 g/dL (ref 12.0–15.0)
Immature Granulocytes: 0 %
Lymphocytes Relative: 30 %
Lymphs Abs: 1.5 10*3/uL (ref 0.7–4.0)
MCH: 25.7 pg — ABNORMAL LOW (ref 26.0–34.0)
MCHC: 31.3 g/dL (ref 30.0–36.0)
MCV: 82.2 fL (ref 80.0–100.0)
Monocytes Absolute: 0.4 10*3/uL (ref 0.1–1.0)
Monocytes Relative: 8 %
Neutro Abs: 2.9 10*3/uL (ref 1.7–7.7)
Neutrophils Relative %: 59 %
Platelet Count: 243 10*3/uL (ref 150–400)
RBC: 4.67 MIL/uL (ref 3.87–5.11)
RDW: 12.7 % (ref 11.5–15.5)
WBC Count: 4.9 10*3/uL (ref 4.0–10.5)
nRBC: 0 % (ref 0.0–0.2)

## 2018-10-18 LAB — IRON AND TIBC
Iron: 57 ug/dL (ref 41–142)
Saturation Ratios: 15 % — ABNORMAL LOW (ref 21–57)
TIBC: 388 ug/dL (ref 236–444)
UIBC: 331 ug/dL (ref 120–384)

## 2018-10-18 LAB — FERRITIN: Ferritin: 29 ng/mL (ref 11–307)

## 2018-10-18 NOTE — Assessment & Plan Note (Addendum)
02/01/2018: Left leg DVT involving left popliteal vein, left posterior tibial vein, left peroneal vein, left soleal vein and left gastrocnemius vein Treatment summary: Lovenox throughout pregnancy July 2020: Normal delivery  I discussed with the patient risk factors for blood clots.  Inherited risk factors include: 1. Factor V Leiden mutation 2. Prothrombin gene G20210A 3. Protein S deficiency  4. Protein C deficiency  5. Antithrombin deficiency  Acquired risk factors include: 1. Antiphospholipid antibody syndrome: Being checked today 2. Tobacco use: Patient does not smoke 3. Obesity: Patient is very fit 4. Medications including oral contraceptives: Patient does not take 5. Sedentary behavior including postoperative state: Patient was long distance drive to [place] before the blood clot happened 6. Foreign bodies in circulation: None  Workup recommended: Bloodwork to evaluate for the 5 inherited factors mentioned above along with antiphospholipid antibodies.  Severe anemia during peripartum: Hemoglobin had dropped down to 6.5 which was previously normal.  We will recheck the CBC today.   Patient has discontinued anticoagulation 1-2 weeks prior to delivery. Return to clinic through a Doximity video visit in one to 2 weeks to discuss the results of the tests

## 2018-10-19 ENCOUNTER — Telehealth: Payer: Self-pay | Admitting: Hematology and Oncology

## 2018-10-19 LAB — LUPUS ANTICOAGULANT PANEL
DRVVT: 41.3 s (ref 0.0–47.0)
PTT Lupus Anticoagulant: 32.5 s (ref 0.0–51.9)

## 2018-10-19 LAB — PROTEIN C, TOTAL: Protein C, Total: 70 % (ref 60–150)

## 2018-10-19 LAB — ANTITHROMBIN III ANTIGEN: AT III AG PPP IMM-ACNC: 99 % (ref 72–124)

## 2018-10-19 LAB — PROTEIN S, TOTAL: Protein S Ag, Total: 97 % (ref 60–150)

## 2018-10-19 NOTE — Telephone Encounter (Signed)
I talk with patient regarding video call °

## 2018-10-21 LAB — PROTHROMBIN GENE MUTATION

## 2018-10-24 LAB — FACTOR 5 LEIDEN

## 2018-10-24 NOTE — Progress Notes (Signed)
HEMATOLOGY-ONCOLOGY DOXIMITY VISIT PROGRESS NOTE  I connected with Marie Wade on 10/25/2018 atDaivd Wade  9:15 AM EDT by Doximity video conference and verified that I am speaking with the correct person using two identifiers.  I discussed the limitations, risks, security and privacy concerns of performing an evaluation and management service by Doximity and the availability of in person appointments.  I also discussed with the patient that there may be a patient responsible charge related to this service. The patient expressed understanding and agreed to proceed.  Patient's Location: Home Physician Location: Clinic  CHIEF COMPLIANT: Follow-up of anemia and DVT  INTERVAL HISTORY: Marie Wade is a 28 y.o. female with above-mentioned history of DVT and anemia during pregnancy. Labs on 10/18/18 showed: Hg 12.0, HCT 38.4, iron saturation 15%, ferritin 29, antithrombin III antigen 99, total protein S 97, total protein C 70, Factor V Leiden negative, PT gene mutation negative, lupus anticoagulant negative. She presents over Doximity today to review her labs and discuss further treatment.   REVIEW OF SYSTEMS:   Constitutional: Denies fevers, chills or abnormal weight loss Eyes: Denies blurriness of vision Ears, nose, mouth, throat, and face: Denies mucositis or sore throat Respiratory: Denies cough, dyspnea or wheezes Cardiovascular: Denies palpitation, chest discomfort Gastrointestinal:  Denies nausea, heartburn or change in bowel habits Skin: Denies abnormal skin rashes Lymphatics: Denies new lymphadenopathy or easy bruising Neurological:Denies numbness, tingling or new weaknesses Behavioral/Psych: Mood is stable, no new changes  Extremities: No lower extremity edema Breast: denies any pain or lumps or nodules in either breasts All other systems were reviewed with the patient and are negative.  Observations/Objective:  There were no vitals filed for this visit. There is no height or weight on  file to calculate BMI.  I have reviewed the data as listed CMP Latest Ref Rng & Units 08/19/2018 01/30/2018  Glucose 70 - 99 mg/dL - 85  BUN 6 - 20 mg/dL - 13  Creatinine 1.610.44 - 1.00 mg/dL 0.960.60 0.450.60  Sodium 409135 - 145 mmol/L - 134(L)  Potassium 3.5 - 5.1 mmol/L - 3.9  Chloride 98 - 111 mmol/L - 103  CO2 22 - 32 mmol/L - 23  Calcium 8.9 - 10.3 mg/dL - 9.0  Total Protein 6.5 - 8.1 g/dL - 7.8  Total Bilirubin 0.3 - 1.2 mg/dL - 0.5  Alkaline Phos 38 - 126 U/L - 47  AST 15 - 41 U/L - 27  ALT 0 - 44 U/L - 27    Lab Results  Component Value Date   WBC 4.9 10/18/2018   HGB 12.0 10/18/2018   HCT 38.4 10/18/2018   MCV 82.2 10/18/2018   PLT 243 10/18/2018   NEUTROABS 2.9 10/18/2018      Assessment Plan:  DVT (deep vein thrombosis) in pregnancy 02/01/2018: Left leg DVT involving left popliteal vein, left posterior tibial vein, left peroneal vein, left soleal vein and left gastrocnemius vein Treatment summary: Lovenox throughout pregnancy July 2020: Normal delivery  Hypercoagulability work-up review: 1.  Lupus anticoagulant: Negative 2.  Protein C, protein S, Antithrombin, factor V Leiden, prothrombin gene mutation: Normal  Iron studies: Iron saturation 15%, ferritin 29, hemoglobin 12 Inform the patient there is no abnormality detected on hypercoagulability panel.  Therefore there is no need to reinitiate anticoagulation at this time.  If she wants to get pregnant another time then she will need to remain on blood thinners for the entire duration of the pregnancy.  Return to clinic on an as-needed basis.   I discussed the  assessment and treatment plan with the patient. The patient was provided an opportunity to ask questions and all were answered. The patient agreed with the plan and demonstrated an understanding of the instructions. The patient was advised to call back or seek an in-person evaluation if the symptoms worsen or if the condition fails to improve as anticipated.   I  provided 15 minutes of face-to-face Doximity time during this encounter.    Rulon Eisenmenger, MD 10/25/2018   I, Molly Dorshimer, am acting as scribe for Nicholas Lose, MD.  I have reviewed the above documentation for accuracy and completeness, and I agree with the above.

## 2018-10-25 ENCOUNTER — Inpatient Hospital Stay (HOSPITAL_BASED_OUTPATIENT_CLINIC_OR_DEPARTMENT_OTHER): Payer: Medicaid Other | Admitting: Hematology and Oncology

## 2018-10-25 ENCOUNTER — Telehealth: Payer: Self-pay | Admitting: Student

## 2018-10-25 ENCOUNTER — Other Ambulatory Visit: Payer: Self-pay

## 2018-10-25 ENCOUNTER — Ambulatory Visit: Payer: Medicaid Other | Admitting: Student

## 2018-10-25 DIAGNOSIS — O2230 Deep phlebothrombosis in pregnancy, unspecified trimester: Secondary | ICD-10-CM | POA: Diagnosis not present

## 2018-10-25 DIAGNOSIS — Z86718 Personal history of other venous thrombosis and embolism: Secondary | ICD-10-CM | POA: Diagnosis not present

## 2018-10-25 DIAGNOSIS — Z79899 Other long term (current) drug therapy: Secondary | ICD-10-CM | POA: Diagnosis not present

## 2018-10-25 DIAGNOSIS — Z791 Long term (current) use of non-steroidal anti-inflammatories (NSAID): Secondary | ICD-10-CM | POA: Diagnosis not present

## 2018-10-25 DIAGNOSIS — Z7901 Long term (current) use of anticoagulants: Secondary | ICD-10-CM | POA: Diagnosis not present

## 2018-10-25 DIAGNOSIS — E669 Obesity, unspecified: Secondary | ICD-10-CM | POA: Diagnosis not present

## 2018-10-25 DIAGNOSIS — D649 Anemia, unspecified: Secondary | ICD-10-CM | POA: Diagnosis not present

## 2018-10-25 NOTE — Telephone Encounter (Signed)
Patient called because she was not able to make it today. I spoke with Pam CMA said if she can feel the string she will not need to reschedule. She stated if she was not able to, she would call for an appointment.

## 2018-10-25 NOTE — Assessment & Plan Note (Signed)
02/01/2018: Left leg DVT involving left popliteal vein, left posterior tibial vein, left peroneal vein, left soleal vein and left gastrocnemius vein Treatment summary: Lovenox throughout pregnancy July 2020: Normal delivery  Hypercoagulability work-up review: 1.  Lupus anticoagulant: Negative 2.  Protein C, protein S, Antithrombin, factor V Leiden, prothrombin gene mutation: Normal  Iron studies: Iron saturation 15%, ferritin 29, hemoglobin 12 Inform the patient there is no abnormality detected on hypercoagulability panel.  Therefore there is no need to reinitiate anticoagulation at this time.  If she wants to get pregnant another time then she will need to remain on blood thinners for the entire duration of the pregnancy.  Return to clinic on an as-needed basis.

## 2018-11-29 ENCOUNTER — Ambulatory Visit: Payer: Medicaid Other | Admitting: Women's Health

## 2018-12-19 ENCOUNTER — Telehealth: Payer: Self-pay | Admitting: Student

## 2018-12-19 NOTE — Telephone Encounter (Signed)
Attempted to call patient about her appointment on 11/17 @ 10:15. No answer left voicemail instructing patient to wear a face mask for the entire appointment and no visitors are allowed during the visit. Patient instructed not to attend the appointment if she was any symptoms. Symptom list and office number left.

## 2018-12-20 ENCOUNTER — Ambulatory Visit (INDEPENDENT_AMBULATORY_CARE_PROVIDER_SITE_OTHER): Payer: Medicaid Other | Admitting: Student

## 2018-12-20 ENCOUNTER — Other Ambulatory Visit: Payer: Self-pay

## 2018-12-20 ENCOUNTER — Encounter: Payer: Self-pay | Admitting: Student

## 2018-12-20 DIAGNOSIS — Z30430 Encounter for insertion of intrauterine contraceptive device: Secondary | ICD-10-CM

## 2018-12-20 DIAGNOSIS — Z30432 Encounter for removal of intrauterine contraceptive device: Secondary | ICD-10-CM

## 2018-12-20 HISTORY — DX: Encounter for insertion of intrauterine contraceptive device: Z30.430

## 2018-12-20 NOTE — Progress Notes (Signed)
History:  [name] is a 28 y.o. [id number] who presents to clinic today for removal of IUD. She desires to conceive again. Her IUD is not bothering her. She has no complaints or pain today; she declines STI testing.   The following portions of the patient's history were reviewed and updated as appropriate: allergies, current medications, family history, past medical history, social history, past surgical history and problem list.  Review of Systems:  Review of Systems  Constitutional: Negative.   HENT: Negative.   Respiratory: Negative.   Cardiovascular: Negative.   Skin: Negative.       Objective:  Physical Exam BP 115/61   Pulse (!) 57   Ht 5\' 8"  (1.727 m)   Wt 168 lb 12.8 oz (76.6 kg)   BMI 25.67 kg/m  Physical Exam  Constitutional: She appears well-developed.  HENT:  Head: Normocephalic.  Neck: Normal range of motion.  Respiratory: Effort normal.  GI: Soft.  Neurological: She is alert.  Skin: Skin is warm.      Labs and Imaging No results found for this or any previous visit (from the past 24 hour(s)).  No results found.   Assessment & Plan:   1. Encounter for IUD removal   2. Patient tolerated procedure well; IUD removed intact.  3. Advised patient to begin taking prenatal vitamins and return to [facility] when she is ready to start prenatal care.   Marie Wade, Perkins 12/20/2018 12:35 PM    IUD Removal  Patient was in the dorsal lithotomy position, normal external genitalia was noted.  A speculum was placed in the patient's vagina, normal discharge was noted, no lesions. The multiparous cervix was visualized, no lesions, no abnormal discharge.  The strings of the IUD were grasped and pulled using ring forceps. The IUD was removed in its entirety. Patient tolerated the procedure well.

## 2019-01-02 ENCOUNTER — Encounter: Payer: Self-pay | Admitting: *Deleted

## 2019-01-31 ENCOUNTER — Ambulatory Visit: Payer: Medicaid Other | Admitting: Family Medicine

## 2019-02-02 ENCOUNTER — Encounter: Payer: Self-pay | Admitting: Family Medicine

## 2019-02-02 ENCOUNTER — Other Ambulatory Visit: Payer: Self-pay

## 2019-02-02 ENCOUNTER — Ambulatory Visit (INDEPENDENT_AMBULATORY_CARE_PROVIDER_SITE_OTHER): Payer: Medicaid Other | Admitting: Family Medicine

## 2019-02-02 VITALS — BP 100/64 | HR 76 | Temp 98.6°F | Resp 15 | Ht 68.0 in | Wt 174.1 lb

## 2019-02-02 DIAGNOSIS — Z3009 Encounter for other general counseling and advice on contraception: Secondary | ICD-10-CM | POA: Diagnosis not present

## 2019-02-02 DIAGNOSIS — O2230 Deep phlebothrombosis in pregnancy, unspecified trimester: Secondary | ICD-10-CM | POA: Diagnosis not present

## 2019-02-02 DIAGNOSIS — L700 Acne vulgaris: Secondary | ICD-10-CM | POA: Insufficient documentation

## 2019-02-02 MED ORDER — ADAPALENE 0.3 % EX GEL: 1.0000 "application " | Freq: Every day | CUTANEOUS | 0 refills | Status: DC

## 2019-02-02 MED ORDER — AZITHROMYCIN 500 MG PO TABS: ORAL_TABLET | ORAL | 0 refills | Status: DC

## 2019-02-02 NOTE — Progress Notes (Signed)
Subjective:  Patient ID: Marie Wade, female    DOB: 10/23/1990  Age: 28 y.o. MRN: [id number]  CC:  Chief Complaint  Patient presents with  . New Patient (Initial Visit)    establish care. also wants to discuss acne      HPI  HPI Marie Wade is a 28 year old female patient who is here to establish care.  Has a history of having a blood clot with pregnancy.  Had been on Lovenox for this.  Is currently not taking any medications at this time.  Wants to establish care as she is thinking that she might need to be back on a birth control and would like something to help with her adult acne.  Up-to-date on all health maintenance needs.  Works for [employer].  Is trying to get a physician closer to home.  Has a significant family history of cancer, diabetes, heart disease, blood clots in multiple family members.  Denies use of alcohol, illicit drugs or for smoking.  Lives with significant other fianc would like to get married in January 2021.  They just had a 24-month-old baby girl named July.  She has a son and daughter by a previous man who is not really in the lives of the children.  But she reports that all is doing well.  They also have cats in their home but no allergies are noted. She avoids pork, limits red meat, avoid dairy as best as possible drinks a lot more almond milk.  Is not currently breast-feeding at this time reports that she is tired daily so she drinks coffee regularly now.  She drinks only about 3 cups of water and knows that she needs to drink more.  Her main concern is the fact that she has adult acne.  Was sent to a dermatologist when she was younger she used Cetaphil to use proactive nothing is really helped she is now had some cystic acne with a birth control switch that she had in the past.  She reports that she has never used any antibiotics or topical creams outside of the proactive.  Would like to try something for this.  Additionally she reports  that she would like to be on birth control because she knows that birth control can help with some acne.  But she is unsure of which birth control to try she has tried IUDs including to [place] and [place].  But reports that they do not do very well with her as far as the length of bleeding and side effects with acne as well.  Today patient denies signs and symptoms of COVID 19 infection including fever, chills, cough, shortness of breath, and headache. Past Medical, Surgical, Social History, Allergies, and Medications have been Reviewed.   Past Medical History:  Diagnosis Date  . DVT (deep vein thrombosis) in pregnancy     Current Meds  Medication Sig  . prenatal vitamin w/FE, FA (PRENATAL 1 + 1) 27-1 MG TABS tablet Take 1 tablet by mouth daily at 12 noon.    ROS:  Review of Systems  Constitutional: Negative.   HENT: Negative.   Eyes: Negative.   Respiratory: Negative.   Cardiovascular: Negative.   Gastrointestinal: Negative.   Genitourinary: Negative.   Musculoskeletal: Negative.   Skin: Negative.        Acne   Neurological: Negative.   Endo/Heme/Allergies: Negative.   Psychiatric/Behavioral: Negative.      Objective:   Today's Vitals: BP 100/64   Pulse  76   Temp 98.6 F (37 C) (Oral)   Resp 15   Ht 5\' 8"  (1.727 m)   Wt 174 lb 1.9 oz (79 kg)   SpO2 94%   BMI 26.47 kg/m  Vitals with BMI 02/02/2019 12/20/2018 10/18/2018  Height 5\' 8"  5\' 8"  5\' 8"   Weight 174 lbs 2 oz 168 lbs 13 oz 169 lbs 6 oz  BMI 26.48 25.67 25.76  Systolic 100 115 10/20/2018  Diastolic 64 61 70  Pulse 76 57 74     Physical Exam Vitals and nursing note reviewed.  Constitutional:      Appearance: Normal appearance. She is well-developed, well-groomed and overweight.  HENT:     Head: Normocephalic and atraumatic.     Right Ear: External ear normal.     Left Ear: External ear normal.     Nose: Nose normal.     Mouth/Throat:     Mouth: Mucous membranes are moist.     Pharynx: Oropharynx is  clear.  Eyes:     General:        Right eye: No discharge.        Left eye: No discharge.     Conjunctiva/sclera: Conjunctivae normal.  Cardiovascular:     Rate and Rhythm: Normal rate and regular rhythm.     Pulses: Normal pulses.     Heart sounds: Normal heart sounds.  Pulmonary:     Effort: Pulmonary effort is normal.     Breath sounds: Normal breath sounds.  Musculoskeletal:        General: Normal range of motion.     Cervical back: Normal range of motion and neck supple.  Skin:    General: Skin is warm.     Findings: Acne present.  Neurological:     General: No focal deficit present.     Mental Status: She is alert and oriented to person, place, and time.  Psychiatric:        Attention and Perception: Attention and perception normal.        Mood and Affect: Mood and affect normal.        Speech: Speech normal.        Behavior: Behavior normal. Behavior is cooperative.        Thought Content: Thought content normal.        Cognition and Memory: Cognition normal.        Judgment: Judgment normal.     Comments: Pleasant in communication good eye contact          Assessment   1. Acne vulgaris   2. Family planning advice   3. DVT (deep vein thrombosis) in pregnancy     Tests ordered No orders of the defined types were placed in this encounter.    Plan: Please see assessment and plan per problem list above.   Meds ordered this encounter  Medications  . azithromycin (ZITHROMAX) 500 MG tablet    Sig: Take 500 mg (1 tablet) daily for 4 days in a row. Then repeat for 3 months.    Dispense:  12 tablet    Refill:  0    Order Specific Question:   Supervising Provider    Answer:   SIMPSON, MARGARET E [[id number]]  . Adapalene 0.3 % gel    Sig: Apply 1 application topically at bedtime.    Dispense:  45 g    Refill:  0    Order Specific Question:   Supervising Provider    Answer:  SIMPSON, MARGARET E [[id number]]    Patient to follow-up in 3 months -phone.  Freddy FinnerHannah M  Jereline Ticer, NP

## 2019-02-02 NOTE — Patient Instructions (Addendum)
I appreciate the opportunity to provide you with care for your health and wellness. Today we discussed: acne and birth control methods   Follow up: 3 months -phone visit   No labs or referrals today  Start using the z-pak and adapalene gel   Look up oil cleansing method for acne and facial care  Restart Prenatal   Vitamin D3 start taking 1,000 IU daily   Please continue to practice social distancing to keep you, your family, and our community safe.  If you must go out, please wear a mask and practice good handwashing.  It was a pleasure to see you and I look forward to continuing to work together on your health and well-being. Please do not hesitate to call the office if you need care or have questions about your care.  Have a wonderful day and week. With Gratitude, Cherly Beach, DNP, AGNP-BC

## 2019-02-02 NOTE — Assessment & Plan Note (Signed)
Prescribed this Zithromax to use over the next 3 months.  In addition have prescribed adapalene gel.  I have also educated her on the use of more natural methods such as the oil cleansing method if she would like to use something does not prescription strength.  Reviewed side effects, risks and benefits of medication.   Patient acknowledged agreement and understanding of the plan.

## 2019-02-02 NOTE — Assessment & Plan Note (Signed)
Is no longer on Lovenox was followed up by hematology.  Advised against estrogen today as she would like birth control to help with acne we will try other courses of action.  Educated on the side effects of estrogen in the body and higher risk due to also family history multiple members having blood clots.

## 2019-02-02 NOTE — Assessment & Plan Note (Signed)
Advised against being on estrogen secondary to having DVT in pregnancy.  Encouraged her to learn about calendar method, use of condoms, diaphragm, possible IUD replacement.  Reports that she would like to try to see sent by that here locally if she does change her mind about getting the IUD then she would like a referral to OB here in town.

## 2019-02-04 ENCOUNTER — Encounter (HOSPITAL_COMMUNITY): Payer: Self-pay

## 2019-02-04 ENCOUNTER — Emergency Department (HOSPITAL_COMMUNITY)
Admission: EM | Admit: 2019-02-04 | Discharge: 2019-02-04 | Disposition: A | Discharge status: Home / Self Care | Payer: No Typology Code available for payment source | Attending: Emergency Medicine | Admitting: Emergency Medicine

## 2019-02-04 ENCOUNTER — Other Ambulatory Visit: Payer: Self-pay

## 2019-02-04 DIAGNOSIS — M546 Pain in thoracic spine: Secondary | ICD-10-CM | POA: Diagnosis not present

## 2019-02-04 DIAGNOSIS — M791 Myalgia, unspecified site: Secondary | ICD-10-CM | POA: Diagnosis not present

## 2019-02-04 LAB — POC URINE PREG, ED: Preg Test, Ur: NEGATIVE

## 2019-02-04 MED ORDER — CYCLOBENZAPRINE HCL 10 MG PO TABS: 10.0000 mg | ORAL_TABLET | Freq: Two times a day (BID) | ORAL | 0 refills | Status: DC | PRN

## 2019-02-04 MED ORDER — METHOCARBAMOL 500 MG PO TABS
500.0000 mg | ORAL_TABLET | Freq: Once | ORAL | Status: AC
  Administered 2019-02-04: 02:00:00 500 mg via ORAL
  Filled 2019-02-04: qty 1

## 2019-02-04 MED ORDER — IBUPROFEN 800 MG PO TABS: 800.0000 mg | ORAL_TABLET | Freq: Three times a day (TID) | ORAL | 0 refills | Status: DC

## 2019-02-04 MED ORDER — KETOROLAC TROMETHAMINE 60 MG/2ML IM SOLN
60.0000 mg | Freq: Once | INTRAMUSCULAR | Status: AC
  Administered 2019-02-04: 02:00:00 60 mg via INTRAMUSCULAR
  Filled 2019-02-04: qty 2
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
## 2019-02-04 NOTE — ED Notes (Signed)
ED Provider at bedside. 
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
## 2019-02-08 ENCOUNTER — Encounter: Payer: Self-pay | Admitting: Family Medicine

## 2019-02-08 ENCOUNTER — Ambulatory Visit (INDEPENDENT_AMBULATORY_CARE_PROVIDER_SITE_OTHER): Payer: No Typology Code available for payment source | Admitting: Family Medicine

## 2019-02-08 ENCOUNTER — Other Ambulatory Visit: Payer: Self-pay

## 2019-02-08 VITALS — BP 108/72 | HR 74 | Temp 98.5°F | Resp 15 | Ht 68.0 in | Wt 170.0 lb

## 2019-02-08 DIAGNOSIS — M791 Myalgia, unspecified site: Secondary | ICD-10-CM

## 2019-02-08 HISTORY — DX: Myalgia, unspecified site: M79.10

## 2019-02-08 MED ORDER — METHYLPREDNISOLONE ACETATE 80 MG/ML IJ SUSP
80.0000 mg | Freq: Once | INTRAMUSCULAR | Status: AC
  Administered 2019-02-08: 16:00:00 80 mg via INTRAMUSCULAR

## 2019-02-08 MED ORDER — KETOROLAC TROMETHAMINE 60 MG/2ML IM SOLN
60.0000 mg | Freq: Once | INTRAMUSCULAR | Status: AC
  Administered 2019-02-08: 60 mg via INTRAMUSCULAR
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
## 2019-02-14 ENCOUNTER — Telehealth: Payer: Self-pay | Admitting: *Deleted

## 2019-02-14 NOTE — Telephone Encounter (Signed)
Disability forms copied noted sleeved

## 2019-03-01 ENCOUNTER — Ambulatory Visit: Payer: Medicaid Other | Admitting: Family Medicine

## 2019-03-07 ENCOUNTER — Telehealth: Payer: Self-pay

## 2019-03-07 NOTE — Telephone Encounter (Signed)
Have made multiple attempts to speak with patient regarding her FMLA paperwork. I've also left several messages requesting a call back to discuss the paperwork. Need to know what her return to work date was and if she has improved. Left messages on home number and cell number isn't working.

## 2019-03-09 DIAGNOSIS — M791 Myalgia, unspecified site: Secondary | ICD-10-CM

## 2019-03-09 NOTE — Telephone Encounter (Signed)
After calling patient multiple times and leaving multiple messages with no response, gave FMLA paperwork to office manager to be filed and faxed as per our process.

## 2019-03-30 ENCOUNTER — Encounter: Payer: Self-pay | Admitting: Family Medicine

## 2019-04-02 ENCOUNTER — Other Ambulatory Visit: Payer: Self-pay | Admitting: Family Medicine

## 2019-04-02 DIAGNOSIS — O0990 Supervision of high risk pregnancy, unspecified, unspecified trimester: Secondary | ICD-10-CM

## 2019-04-02 MED ORDER — PRENATAL PLUS 27-1 MG PO TABS: 1.0000 | ORAL_TABLET | Freq: Every day | ORAL | 11 refills | Status: DC

## 2019-04-04 ENCOUNTER — Encounter: Payer: Self-pay | Admitting: Family Medicine

## 2019-05-05 ENCOUNTER — Other Ambulatory Visit: Payer: Self-pay | Admitting: *Deleted

## 2019-05-05 ENCOUNTER — Telehealth (INDEPENDENT_AMBULATORY_CARE_PROVIDER_SITE_OTHER): Payer: No Typology Code available for payment source | Admitting: Family Medicine

## 2019-05-05 ENCOUNTER — Encounter: Payer: Self-pay | Admitting: Family Medicine

## 2019-05-05 ENCOUNTER — Other Ambulatory Visit: Payer: Self-pay

## 2019-05-05 DIAGNOSIS — L700 Acne vulgaris: Secondary | ICD-10-CM

## 2019-05-05 MED ORDER — ADAPALENE 0.3 % EX GEL: 1.0000 "application " | Freq: Every day | CUTANEOUS | 0 refills | Status: DC

## 2019-05-05 NOTE — Patient Instructions (Signed)
I appreciate the opportunity to provide you with care for your health and wellness. Today we discussed: acne and cycle changes  Follow up: Jan 2022 for annual   No labs or referrals today  So glad that your acne has improved! continue the use of adapalene.  Please let me know if you have any other issues or concerns with acne throughout the year.  As long as her cycles are staying regular you are not having any pain or excessive bleeding or fatigue or changes with having intercourse in relation to developing pain or having pelvic pain after or during, I would assume that her cycles are just going to be 21 days.  This can change and fluctuate throughout woman's lifetime.  If you have any of the other changes above or your questioning concern please do not hesitate to reach out to your GYN or me.  Please continue to practice social distancing to keep you, your family, and our community safe.  If you must go out, please wear a mask and practice good handwashing.  It was a pleasure to see you and I look forward to continuing to work together on your health and well-being. Please do not hesitate to call the office if you need care or have questions about your care.  Have a wonderful day and week. With Gratitude, Tereasa Coop, DNP, AGNP-BC

## 2019-05-05 NOTE — Assessment & Plan Note (Signed)
Improved, continue use of adapalene.  She uses this interchangeably with proactive.  Reviewed side effects, risks and benefits of medication.   Patient acknowledged agreement and understanding of the plan.

## 2019-05-05 NOTE — Progress Notes (Signed)
Virtual Visit via Video Note   This visit type was conducted due to national recommendations for restrictions regarding the COVID-19 Pandemic (e.g. social distancing) in an effort to limit this patient's exposure and mitigate transmission in our community.  Due to her co-morbid illnesses, this patient is at least at moderate risk for complications without adequate follow up.  This format is felt to be most appropriate for this patient at this time.  All issues noted in this document were discussed and addressed.  A limited physical exam was performed with this format.    Evaluation Performed:  Follow-up visit  Date:  05/05/2019   ID:  Marie Wade, DOB 07/27/1990, MRN [id number]  Patient Location: Home Provider Location: Office  Location of Patient: Home Location of Provider: Telehealth Consent was obtain for visit to be over via telehealth. I verified that I am speaking with the correct person using two identifiers.  PCP:  Freddy Finner, NP   Chief Complaint: Follow-up on acne  History of Present Illness:    Marie Wade is a 29 y.o. female with very limited history that recently established me 3 months ago.  I started her on Zithromax and adapalene.  She reports that her skin has improved tremendously over the last several months.  And would like to continue using adapalene.  She reports she is taking her prenatal vitamin has no signs and symptoms of being pregnant but she reports that her cycles have changed in the number of days 21 days pretty regularly.  No pelvic pain no irregular discharge or spotting.  No pain with bleeding and no heavy cycles or bleeding noted.  No fatigue or no other new symptoms.    The patient does not have symptoms concerning for COVID-19 infection (fever, chills, cough, or new shortness of breath).   Past Medical, Surgical, Social History, Allergies, and Medications have been Reviewed.  Past Medical History:  Diagnosis Date  . DVT (deep vein  thrombosis) in pregnancy    Past Surgical History:  Procedure Laterality Date  . CESAREAN SECTION N/A 08/19/2018   Procedure: CESAREAN SECTION;  Surgeon: Reva Bores, MD;  Location: [facility];  Service: Obstetrics;  Laterality: N/A;  . DILATION AND CURETTAGE OF UTERUS       Current Meds  Medication Sig  . ibuprofen (ADVIL) 800 MG tablet Take 1 tablet (800 mg total) by mouth 3 (three) times daily.  . prenatal vitamin w/FE, FA (PRENATAL 1 + 1) 27-1 MG TABS tablet Take 1 tablet by mouth daily at 12 noon.  . [DISCONTINUED] Adapalene 0.3 % gel Apply 1 application topically at bedtime.     Allergies:   Watermelon [citrullus vulgaris]   ROS:   Please see the history of present illness.    All other systems reviewed and are negative.   Labs/Other Tests and Data Reviewed:    Recent Labs: 08/19/2018: Creatinine, Ser 0.60 10/18/2018: Hemoglobin 12.0; Platelet Count 243   Recent Lipid Panel No results found for: CHOL, TRIG, HDL, CHOLHDL, LDLCALC, LDLDIRECT  Wt Readings from Last 3 Encounters:  05/05/19 172 lb (78 kg)  02/08/19 170 lb 0.6 oz (77.1 kg)  02/02/19 174 lb 1.9 oz (79 kg)     Objective:    Vital Signs:  BP 108/72   Ht 5\' 8"  (1.727 m)   Wt 172 lb (78 kg)   BMI 26.15 kg/m    VITAL SIGNS:  reviewed GEN:  no acute distress EYES:  sclerae anicteric, EOMI -  Extraocular Movements Intact RESPIRATORY:  normal respiratory effort, symmetric expansion SKIN:  no rash, lesions or ulcers. MUSCULOSKELETAL:  no obvious deformities. NEURO:  alert and oriented x 3, no obvious focal deficit PSYCH:  normal affect  ASSESSMENT & PLAN:     1. Acne vulgaris  - Adapalene 0.3 % gel; Apply 1 application topically at bedtime.  Dispense: 45 g; Refill: 0   Time:   Today, I have spent 10 minutes with the patient with telehealth technology discussing the above problems.     Medication Adjustments/Labs and Tests Ordered: Current medicines are reviewed at length with the patient today.   Concerns regarding medicines are outlined above.   Tests Ordered: No orders of the defined types were placed in this encounter.   Medication Changes: No orders of the defined types were placed in this encounter.   Disposition:  Follow up Jan 2022 for annual  Signed, Perlie Mayo, NP  05/05/2019 10:59 AM     [facility]

## 2019-06-16 ENCOUNTER — Other Ambulatory Visit: Payer: Self-pay | Admitting: Family Medicine

## 2019-06-16 ENCOUNTER — Encounter: Payer: Self-pay | Admitting: Family Medicine

## 2019-06-16 DIAGNOSIS — L700 Acne vulgaris: Secondary | ICD-10-CM

## 2019-06-16 MED ORDER — DOXYCYCLINE HYCLATE 100 MG PO TBEC: 100.0000 mg | DELAYED_RELEASE_TABLET | Freq: Every day | ORAL | 0 refills | Status: DC

## 2019-08-21 ENCOUNTER — Encounter: Payer: Self-pay | Admitting: Family Medicine

## 2019-08-22 ENCOUNTER — Other Ambulatory Visit: Payer: Self-pay | Admitting: Family Medicine

## 2019-08-22 DIAGNOSIS — L700 Acne vulgaris: Secondary | ICD-10-CM

## 2019-08-22 MED ORDER — ADAPALENE 0.3 % EX GEL: 1.0000 "application " | Freq: Every day | CUTANEOUS | 1 refills | Status: DC

## 2019-08-28 ENCOUNTER — Other Ambulatory Visit: Payer: Self-pay

## 2019-08-28 ENCOUNTER — Ambulatory Visit (INDEPENDENT_AMBULATORY_CARE_PROVIDER_SITE_OTHER): Payer: Medicaid Other | Admitting: *Deleted

## 2019-08-28 DIAGNOSIS — Z3200 Encounter for pregnancy test, result unknown: Secondary | ICD-10-CM

## 2019-08-28 DIAGNOSIS — Z3201 Encounter for pregnancy test, result positive: Secondary | ICD-10-CM | POA: Diagnosis not present

## 2019-08-28 DIAGNOSIS — Z3A14 14 weeks gestation of pregnancy: Secondary | ICD-10-CM

## 2019-08-28 LAB — POCT PREGNANCY, URINE: Preg Test, Ur: POSITIVE — AB

## 2019-08-28 NOTE — Progress Notes (Signed)
Pt dropped off urine earlier today for upt. UPT today is positive. Pt notified at home after verifying name and DOB. SHe states she had positive upt at home and just wanted to confirm pregnancy. SHe is not having any pain, bleeding, or other concerns. LMP 05/21/19 which would make her 14wk1d by LMP. Advised to call provider of choice asap to make appt to begin [facility]. May go to [facility] for any pregnancy concerns as needed. Pt voices understanding.

## 2019-08-29 NOTE — Progress Notes (Signed)
Agree with A & P. 

## 2019-10-02 ENCOUNTER — Telehealth: Payer: Self-pay | Admitting: Family Medicine

## 2019-10-02 NOTE — Telephone Encounter (Signed)
Patient is requesting a call back because she has not felt any movement, and she is about 19 weeks.

## 2019-10-02 NOTE — Telephone Encounter (Signed)
Called pt and VM left at both listed phone numbers. Encouraged pt to call back with any questions or to follow up at virtual appt this Thursday. MyChart message sent.

## 2019-10-05 ENCOUNTER — Other Ambulatory Visit: Payer: Self-pay

## 2019-10-05 ENCOUNTER — Telehealth (INDEPENDENT_AMBULATORY_CARE_PROVIDER_SITE_OTHER): Payer: Medicaid Other | Admitting: *Deleted

## 2019-10-05 ENCOUNTER — Ambulatory Visit (INDEPENDENT_AMBULATORY_CARE_PROVIDER_SITE_OTHER): Payer: Medicaid Other | Admitting: Obstetrics and Gynecology

## 2019-10-05 ENCOUNTER — Encounter: Payer: Self-pay | Admitting: Obstetrics and Gynecology

## 2019-10-05 ENCOUNTER — Other Ambulatory Visit (HOSPITAL_COMMUNITY)
Admission: RE | Admit: 2019-10-05 | Discharge: 2019-10-05 | Disposition: A | Discharge status: Home / Self Care | Payer: Medicaid Other | Source: Ambulatory Visit | Attending: Advanced Practice Midwife | Admitting: Advanced Practice Midwife

## 2019-10-05 VITALS — BP 133/80 | HR 97 | Wt 198.0 lb

## 2019-10-05 DIAGNOSIS — Z3490 Encounter for supervision of normal pregnancy, unspecified, unspecified trimester: Secondary | ICD-10-CM

## 2019-10-05 DIAGNOSIS — O0990 Supervision of high risk pregnancy, unspecified, unspecified trimester: Secondary | ICD-10-CM | POA: Diagnosis not present

## 2019-10-05 DIAGNOSIS — Z86718 Personal history of other venous thrombosis and embolism: Secondary | ICD-10-CM

## 2019-10-05 DIAGNOSIS — Z8759 Personal history of other complications of pregnancy, childbirth and the puerperium: Secondary | ICD-10-CM

## 2019-10-05 DIAGNOSIS — Z8632 Personal history of gestational diabetes: Secondary | ICD-10-CM

## 2019-10-05 DIAGNOSIS — Z98891 History of uterine scar from previous surgery: Secondary | ICD-10-CM

## 2019-10-05 LAB — POCT URINALYSIS DIP (DEVICE)
Bilirubin Urine: NEGATIVE
Glucose, UA: NEGATIVE mg/dL
Hgb urine dipstick: NEGATIVE
Ketones, ur: NEGATIVE mg/dL
Leukocytes,Ua: NEGATIVE
Nitrite: NEGATIVE
Protein, ur: 30 mg/dL — AB
Specific Gravity, Urine: >=1.03 (ref 1.005–1.030)
Urobilinogen, UA: 0.2 mg/dL (ref 0.0–1.0)
pH: 6 (ref 5.0–8.0)

## 2019-10-05 MED ORDER — ENOXAPARIN SODIUM 40 MG/0.4ML ~~LOC~~ SOLN: 40.0000 mg | SUBCUTANEOUS | 2 refills | Status: DC

## 2019-10-05 NOTE — Addendum Note (Signed)
Addended by: Olene Craven B on: 10/05/2019 04:47 PM   Modules accepted: Orders

## 2019-10-05 NOTE — Patient Instructions (Signed)
  At [facility], we work as an integrated team, providing care to address both physical and emotional health. Your medical provider may refer you to see our Behavioral Health Clinician (BHC) on the same day you see your medical provider, as availability permits.  Our BHC is available to all patients, visits generally last between 20-30 minutes, but can be longer or shorter, depending on patient need. The BHC offers help with stress management, coping with symptoms of depression and anxiety, major life changes , sleep issues, changing risky behavior, grief and loss, life stress, working on personal life goals, and  behavioral health issues, as these all affect your overall health and wellness.  The BHC is NOT available for the following: FMLA paperwork, court-ordered evaluations, specialty assessments (custody or disability), letters to employers, or obtaining certification for an emotional support animal. The BHC does not provide long-term therapy. You have the right to refuse integrated behavioral health services, or to reschedule to see the BHC at a later date.  Exception: If you are having thoughts of suicide, we require that you either see the BHC for further assessment, or contract for safety with your medical provider. Confidentiality exception: If it is suspected that a child or disabled adult is being abused or neglected, we are required by law to report that to either Child Protective Services or Adult Protective Services.  If you have a diagnosis of Bipolar affective disorder, Schizophrenia, or recurrent Major depressive disorder, we will recommend that you establish care with a psychiatrist, as these are lifelong, chronic conditions, and we want your overall emotional health and medications to be more closely monitored. If you anticipate needing extended maternity leave due to mental illness, it it recommended you inform your medical provider, so  we can put in a referral to a  psychiatrist as soon as possible. The BHC is unable to recommend an extended maternity leave for mental health issues. Your medical provider or BHC may refer you to a therapist for ongoing, traditional therapy, or to a psychiatrist, for medication management, if it would benefit your overall health. Depending on your insurance, you may have a copay to see the BHC. If you are uninsured, it is recommended that you apply for financial assistance. (Forms may be requested at the front desk for in-person visits, via MyChart, or request a form during a virtual visit).  If you see the BHC more than 6 times, you will have to complete a comprehensive clinical assessment interview with the BHC to resume integrated services.  For virtual visits with the BHC, you must be physically in the state of [place] at the time of the visit. For example, if you live in [place], you will have to do an in-person visit with the BHC. If you are going out of the state or country for any reason, the BHC may see you virtually when you return to [place], but not while you are physically outside of [place].    

## 2019-10-05 NOTE — Progress Notes (Signed)
I called Marie Wade again and informed her MD wants to move appointment to sooner and we have opening today if she can come today at 1:35. She states she can come today. I also informed her I changed her [place] appointment, am trying to schedule doppler studies . I also informed her MD ordered Lovenox injections again prophylactically. She voices understanding. Marie Wade

## 2019-10-05 NOTE — Progress Notes (Signed)
Agree with A & P. 

## 2019-10-05 NOTE — Progress Notes (Signed)
Subjective:  Marie Wade is a 29 y.o. [id number] at 12w4dbeing seen today for her first OB visit. EDD by LMP.   She is currently monitored for the following issues for this high-risk pregnancy and has History of gestational diabetes; Family planning advice; Acne vulgaris; Supervision of high risk pregnancy, antepartum; History of deep vein thrombosis (DVT) during pregnancy; and History of cesarean section, low transverse on their problem list.  Patient reports no complaints.  Contractions: Not present. Vag. Bleeding: None.  Movement: Present. Denies leaking of fluid.   The following portions of the patient's history were reviewed and updated as appropriate: allergies, current medications, past family history, past medical history, past social history, past surgical history and problem list. Problem list updated.  Objective:   Vitals:   10/05/19 1407  BP: 133/80  Pulse: 97  Weight: 198 lb (89.8 kg)    Fetal Status: Fetal Heart Rate (bpm): 140   Movement: Present     General:  Alert, oriented and cooperative. Patient is in no acute distress.  Skin: Skin is warm and dry. No rash noted.   Cardiovascular: Normal heart rate noted  Respiratory: Normal respiratory effort, no problems with respiration noted  Abdomen: Soft, gravid, appropriate for gestational age. Pain/Pressure: Absent     Pelvic:  Cervical exam deferred        Extremities: Normal range of motion.  Edema: None  Mental Status: Normal mood and affect. Normal behavior. Normal judgment and thought content.   Urinalysis:      Assessment and Plan:  Pregnancy: [id number] 119w4d1. Supervision of high risk pregnancy, antepartum Stable Prenatal care and labs reviewed with pt Genetic testing reviewed - [facility]/D/Plt+RPR+Rh+ABO+Rub Ab... - Hemoglobin A1c - CHL AMB BABYSCRIPTS SCHEDULE OPTIMIZATION - Culture, OB Urine - Comp Met (CMET)  2. History of deep vein thrombosis (DVT)  during pregnancy Heme/Onc work up negative Will start Lovenox Reviewed with pt.  3. History of gestational diabetes Will check A1C  4. History of cesarean section, low transverse Will discuss options at later visits  Preterm labor symptoms and general obstetric precautions including but not limited to vaginal bleeding, contractions, leaking of fluid and fetal movement were reviewed in detail with the patient. Please refer to After Visit Summary for other counseling recommendations.  Return in about 4 weeks (around 11/02/2019) for OB visit, face to face, MD only.   ErChancy MilroyMD

## 2019-10-05 NOTE — Progress Notes (Signed)
Scheduled pt for dopplers @ [facility] [phone], [address] on 10/06/19 @ 1540.  Pt notified.    Addison Naegeli, RN  10/05/19

## 2019-10-05 NOTE — Patient Instructions (Signed)

## 2019-10-05 NOTE — Progress Notes (Signed)
Addendum:.  I reviewed pt history with Dr.Ervin and c/o leg discomfort. He advised doppler studies asap and for patient to be started on Lovenox 40mg  subcutaneously prophylactically.  Also to move new ob visit from currently scheduled date of 9/22 to a sooner date with MD instead of App. I have placed doppler order but unable to schedule due to phone issue currently.  I called Jeffifer and left message I was calling back to let her know I have disucssed with provider and we are changing her New OB appointment, scheduling doppler study and moved Marcelino Duster. I will call again. Atreus Hasz,RN

## 2019-10-05 NOTE — Progress Notes (Signed)
New OB Intake  I connected with  Marie Wade on 10/05/19 at 10:15 AM EDT by MyChart and verified that I am speaking with the correct person using two identifiers. Nurse is located at [facility] and pt is located at home.  I discussed the limitations, risks, security and privacy concerns of performing an evaluation and management service by telephone and the availability of in person appointments. I also discussed with the patient that there may be a patient responsible charge related to this service. The patient expressed understanding and agreed to proceed.  I explained I am completing New OB Intake today. We discussed her EDD of 02/25/20 that is based on LMP of 05/21/19. Pt is [id number]/[id number]. I reviewed her allergies, medications, Medical/Surgical/OB history, and appropriate screenings. I informed her of [facility] services. Based on history, this is a/an complicated by History of DVT pregnancy. She does c/o left leg discomfort but denies redness, edema or pain in left leg. We also discussed her medications and I advised her to stop adapalene because it is not recommended in pregnancy  Concerns addressed today  MyChart/Babyscripts MyChart access verified. I explained pt will have some visits in office and some virtually. Babyscripts instructions given. Email sent and patient received and will install after visit.  Blood Pressure Cuff Patient has her bp cuff from last pregnancy and states knows how to use it.  Explained after first prenatal appt pt will check weekly and document in Babyscripts.  Anatomy US Explained first scheduled [place] will be scheduled for asap since she is  19 weeks. Anatomy US scheduled for first available appointment 10/13/19.   Labs Discussed Avelina Laine genetic screening with patient. Would like both Panorama and Horizon drawn at new OB visit. Routine prenatal labs needed.  First visit review I reviewed new OB appt with pt. I explained she will have a pelvic exam, ob bloodwork with  genetic screening,  Explained pt will be seen by Mathews Robinsons at first visit; encounter routed to appropriate provider.  Vaiden Adames,RN 10/05/2019  10:09 AM

## 2019-10-06 ENCOUNTER — Ambulatory Visit
Admission: RE | Admit: 2019-10-06 | Discharge: 2019-10-06 | Disposition: A | Discharge status: Home / Self Care | Payer: Medicaid Other | Source: Ambulatory Visit | Attending: Obstetrics and Gynecology | Admitting: Obstetrics and Gynecology

## 2019-10-06 DIAGNOSIS — O0990 Supervision of high risk pregnancy, unspecified, unspecified trimester: Secondary | ICD-10-CM

## 2019-10-06 DIAGNOSIS — Z3A19 19 weeks gestation of pregnancy: Secondary | ICD-10-CM | POA: Diagnosis not present

## 2019-10-06 DIAGNOSIS — Z8759 Personal history of other complications of pregnancy, childbirth and the puerperium: Secondary | ICD-10-CM

## 2019-10-06 DIAGNOSIS — M79662 Pain in left lower leg: Secondary | ICD-10-CM | POA: Diagnosis not present

## 2019-10-06 LAB — CBC/D/PLT+RPR+RH+ABO+RUB AB...
Antibody Screen: NEGATIVE
Basophils Absolute: 0 10*3/uL (ref 0.0–0.2)
Basos: 0 %
EOS (ABSOLUTE): 0.1 10*3/uL (ref 0.0–0.4)
Eos: 1 %
HCV Ab: <0.1 s/co ratio (ref 0.0–0.9)
HIV Screen 4th Generation wRfx: NONREACTIVE
Hematocrit: 30.6 % — ABNORMAL LOW (ref 34.0–46.6)
Hemoglobin: 10.2 g/dL — ABNORMAL LOW (ref 11.1–15.9)
Hepatitis B Surface Ag: NEGATIVE
Immature Grans (Abs): 0 10*3/uL (ref 0.0–0.1)
Immature Granulocytes: 0 %
Lymphocytes Absolute: 1.8 10*3/uL (ref 0.7–3.1)
Lymphs: 24 %
MCH: 26.6 pg (ref 26.6–33.0)
MCHC: 33.3 g/dL (ref 31.5–35.7)
MCV: 80 fL (ref 79–97)
Monocytes Absolute: 0.7 10*3/uL (ref 0.1–0.9)
Monocytes: 10 %
Neutrophils Absolute: 4.8 10*3/uL (ref 1.4–7.0)
Neutrophils: 65 %
Platelets: 235 10*3/uL (ref 150–450)
RBC: 3.84 x10E6/uL (ref 3.77–5.28)
RDW: 14.1 % (ref 11.7–15.4)
RPR Ser Ql: NONREACTIVE
Rh Factor: POSITIVE
Rubella Antibodies, IGG: 1.96 index (ref >0.99)
WBC: 7.5 10*3/uL (ref 3.4–10.8)

## 2019-10-06 LAB — COMPREHENSIVE METABOLIC PANEL
ALT: 11 IU/L (ref 0–32)
AST: 20 IU/L (ref 0–40)
Albumin/Globulin Ratio: 1.2 (ref 1.2–2.2)
Albumin: 4 g/dL (ref 3.9–5.0)
Alkaline Phosphatase: 64 IU/L (ref 48–121)
BUN/Creatinine Ratio: 12 (ref 9–23)
BUN: 7 mg/dL (ref 6–20)
Bilirubin Total: 0.4 mg/dL (ref 0.0–1.2)
CO2: 21 mmol/L (ref 20–29)
Calcium: 9 mg/dL (ref 8.7–10.2)
Chloride: 105 mmol/L (ref 96–106)
Creatinine, Ser: 0.58 mg/dL (ref 0.57–1.00)
GFR calc Af Amer: 144 mL/min/{1.73_m2} (ref >59)
GFR calc non Af Amer: 125 mL/min/{1.73_m2} (ref >59)
Globulin, Total: 3.3 g/dL (ref 1.5–4.5)
Glucose: 66 mg/dL (ref 65–99)
Potassium: 3.9 mmol/L (ref 3.5–5.2)
Sodium: 138 mmol/L (ref 134–144)
Total Protein: 7.3 g/dL (ref 6.0–8.5)

## 2019-10-06 LAB — GC/CHLAMYDIA PROBE AMP (~~LOC~~) NOT AT ARMC
Chlamydia: NEGATIVE
Comment: NEGATIVE
Comment: NORMAL
Neisseria Gonorrhea: NEGATIVE

## 2019-10-06 LAB — HCV INTERPRETATION

## 2019-10-06 LAB — HEMOGLOBIN A1C
Est. average glucose Bld gHb Est-mCnc: 105 mg/dL
Hgb A1c MFr Bld: 5.3 % (ref 4.8–5.6)

## 2019-10-06 NOTE — Progress Notes (Signed)
Patient was assessed and managed by nursing staff during this encounter. I have reviewed the chart and agree with the documentation and plan. I have also made any necessary editorial changes.  Leianne Callins DNP, CNM  10/06/19  8:00 PM   

## 2019-10-07 LAB — URINE CULTURE, OB REFLEX

## 2019-10-07 LAB — CULTURE, OB URINE

## 2019-10-10 ENCOUNTER — Encounter: Payer: Self-pay | Admitting: *Deleted

## 2019-10-10 ENCOUNTER — Other Ambulatory Visit: Payer: Self-pay

## 2019-10-13 ENCOUNTER — Encounter: Payer: Self-pay | Admitting: *Deleted

## 2019-10-13 ENCOUNTER — Ambulatory Visit: Payer: Medicaid Other | Admitting: *Deleted

## 2019-10-13 ENCOUNTER — Other Ambulatory Visit: Payer: Self-pay | Admitting: Advanced Practice Midwife

## 2019-10-13 ENCOUNTER — Other Ambulatory Visit: Payer: Self-pay

## 2019-10-13 ENCOUNTER — Ambulatory Visit: Payer: Medicaid Other | Attending: Advanced Practice Midwife

## 2019-10-13 ENCOUNTER — Other Ambulatory Visit: Payer: Self-pay | Admitting: *Deleted

## 2019-10-13 DIAGNOSIS — O0990 Supervision of high risk pregnancy, unspecified, unspecified trimester: Secondary | ICD-10-CM

## 2019-10-13 DIAGNOSIS — Z362 Encounter for other antenatal screening follow-up: Secondary | ICD-10-CM

## 2019-10-13 DIAGNOSIS — Z3490 Encounter for supervision of normal pregnancy, unspecified, unspecified trimester: Secondary | ICD-10-CM | POA: Diagnosis not present

## 2019-10-13 DIAGNOSIS — Z86718 Personal history of other venous thrombosis and embolism: Secondary | ICD-10-CM | POA: Diagnosis not present

## 2019-10-13 DIAGNOSIS — Z8759 Personal history of other complications of pregnancy, childbirth and the puerperium: Secondary | ICD-10-CM

## 2019-10-16 ENCOUNTER — Encounter: Payer: Self-pay | Admitting: Obstetrics and Gynecology

## 2019-10-16 ENCOUNTER — Encounter: Payer: Self-pay | Admitting: General Practice

## 2019-10-16 NOTE — Progress Notes (Signed)
    To Whom It May Concern,   Marie Wade is a patient in our office at the Center for [employer] [facility]. She has requested a medical deferment of a mandatory COVID vaccination from her employer due to pregnancy. She has been counseled on the potential benefits and lack of known risks of COVID vaccination, based on the most recent medical literature published by many [employer]. If your organization allows exemption or deferment from COVID vaccination for pregnancy and/or breastfeeding, I verify that this patient meets criteria for those exemptions and can submit for an exemption if desired. Please note that a deferment for a breastfeeding mother is valid for two months from when it is granted and then will require an additional provider visit for extension.   Please feel free to contact our office with any questions  Hermina Staggers, MD 10/16/2019   [facility] [address] [place], [place] [phone]  Center for [facility]  [address], [place], [place]  [phone]  [facility]      [address], [place], [place] [phone]            Center for [facility]    [address] [place], [place] [phone]          Center for [facility]  [address] [place], [place] [phone]  [facility] for [facility] [address] [place], [place] [phone]     Center for [facility]  [address], [place], [place] [phone]

## 2019-10-17 ENCOUNTER — Encounter: Payer: Self-pay | Admitting: General Practice

## 2019-10-23 ENCOUNTER — Encounter: Payer: Medicaid Other | Admitting: Obstetrics and Gynecology

## 2019-10-23 ENCOUNTER — Encounter: Payer: Self-pay | Admitting: General Practice

## 2019-10-25 ENCOUNTER — Encounter: Payer: Medicaid Other | Admitting: Advanced Practice Midwife

## 2019-10-30 ENCOUNTER — Encounter: Payer: Self-pay | Admitting: *Deleted

## 2019-10-30 DIAGNOSIS — O0990 Supervision of high risk pregnancy, unspecified, unspecified trimester: Secondary | ICD-10-CM

## 2019-11-02 ENCOUNTER — Encounter: Payer: Medicaid Other | Admitting: Obstetrics and Gynecology

## 2019-11-03 ENCOUNTER — Telehealth: Payer: Self-pay | Admitting: Lactation Services

## 2019-11-03 NOTE — Telephone Encounter (Signed)
Called patient with results of [employer] testing.   Patient informed that [facility] results show:  1. Silent carrier for Alpha Thalassemia 2. Intermediate Allele size for Fragile X 3. Carrier for Polycystic Kidney disease-Autosomal recessive 4. 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 Increased carrier risk for SMA  Reviewed with patient that we would recommend that you call [facility] to schedule a Telephone Genetic Counseling session. Reviewed that it will be recommended that FOB be tested to see if he carries the same gene.   Will reach out to Dr. Alysia Penna to see if futher evaluation recommended.

## 2019-11-06 ENCOUNTER — Encounter: Payer: Self-pay | Admitting: Lactation Services

## 2019-11-06 ENCOUNTER — Other Ambulatory Visit: Payer: Self-pay | Admitting: Lactation Services

## 2019-11-06 DIAGNOSIS — O285 Abnormal chromosomal and genetic finding on antenatal screening of mother: Secondary | ICD-10-CM

## 2019-11-06 NOTE — Telephone Encounter (Signed)
Yes, please refer for genetic counseling. Thanks Casimiro Needle

## 2019-11-06 NOTE — Telephone Encounter (Signed)
Patient scheduled for Genetic counseling and has been notified.

## 2019-11-06 NOTE — Progress Notes (Signed)
Genetic Counseling ordered by Dr. Alysia Penna based on [facility] Screening.   Patient is  1. Silent Carrier for Alpha Thalassemia 2. Intermediate Allele of Fragile X 3. Carrier for Polycystic Kidney Disease 4. Carrier for Smith-Lemli-Opitz 5. Increased Carrier Risk for SMA  Called MFM to schedule Genetic Counseling with Joyce Gross in MFM for 10/12 @ 10:30 am  Called patient to inform her of Genetic Counseling Appointment, patient did not answer. LM for patient to call the office for appointment details. My Chart message sent.

## 2019-11-10 ENCOUNTER — Ambulatory Visit: Payer: Medicaid Other | Attending: Obstetrics

## 2019-11-10 ENCOUNTER — Other Ambulatory Visit: Payer: Self-pay

## 2019-11-10 ENCOUNTER — Ambulatory Visit: Payer: Medicaid Other | Admitting: *Deleted

## 2019-11-10 ENCOUNTER — Other Ambulatory Visit: Payer: Self-pay | Admitting: *Deleted

## 2019-11-10 DIAGNOSIS — O34219 Maternal care for unspecified type scar from previous cesarean delivery: Secondary | ICD-10-CM | POA: Diagnosis not present

## 2019-11-10 DIAGNOSIS — Z86718 Personal history of other venous thrombosis and embolism: Secondary | ICD-10-CM | POA: Diagnosis not present

## 2019-11-10 DIAGNOSIS — O0990 Supervision of high risk pregnancy, unspecified, unspecified trimester: Secondary | ICD-10-CM | POA: Diagnosis not present

## 2019-11-10 DIAGNOSIS — O99012 Anemia complicating pregnancy, second trimester: Secondary | ICD-10-CM | POA: Diagnosis not present

## 2019-11-10 DIAGNOSIS — O2232 Deep phlebothrombosis in pregnancy, second trimester: Secondary | ICD-10-CM

## 2019-11-10 DIAGNOSIS — Z8759 Personal history of other complications of pregnancy, childbirth and the puerperium: Secondary | ICD-10-CM

## 2019-11-10 DIAGNOSIS — Z362 Encounter for other antenatal screening follow-up: Secondary | ICD-10-CM | POA: Insufficient documentation

## 2019-11-10 DIAGNOSIS — D649 Anemia, unspecified: Secondary | ICD-10-CM

## 2019-11-10 DIAGNOSIS — Z148 Genetic carrier of other disease: Secondary | ICD-10-CM | POA: Diagnosis not present

## 2019-11-10 DIAGNOSIS — Z3A24 24 weeks gestation of pregnancy: Secondary | ICD-10-CM | POA: Diagnosis not present

## 2019-11-14 ENCOUNTER — Ambulatory Visit: Payer: Medicaid Other

## 2019-11-20 ENCOUNTER — Ambulatory Visit (INDEPENDENT_AMBULATORY_CARE_PROVIDER_SITE_OTHER): Payer: Medicaid Other | Admitting: Family Medicine

## 2019-11-20 ENCOUNTER — Other Ambulatory Visit: Payer: Self-pay

## 2019-11-20 ENCOUNTER — Encounter: Payer: Self-pay | Admitting: Family Medicine

## 2019-11-20 VITALS — Wt 205.2 lb

## 2019-11-20 DIAGNOSIS — Z23 Encounter for immunization: Secondary | ICD-10-CM

## 2019-11-20 DIAGNOSIS — Z8759 Personal history of other complications of pregnancy, childbirth and the puerperium: Secondary | ICD-10-CM

## 2019-11-20 DIAGNOSIS — Z98891 History of uterine scar from previous surgery: Secondary | ICD-10-CM

## 2019-11-20 DIAGNOSIS — Z803 Family history of malignant neoplasm of breast: Secondary | ICD-10-CM

## 2019-11-20 DIAGNOSIS — O0990 Supervision of high risk pregnancy, unspecified, unspecified trimester: Secondary | ICD-10-CM

## 2019-11-20 DIAGNOSIS — Z86718 Personal history of other venous thrombosis and embolism: Secondary | ICD-10-CM | POA: Diagnosis not present

## 2019-11-20 NOTE — Patient Instructions (Signed)

## 2019-11-21 NOTE — Progress Notes (Signed)
   PRENATAL VISIT NOTE  Subjective:  Marie Wade is a 29 y.o. [phone] at [redacted]w[redacted]d being seen today for ongoing prenatal care.  She is currently monitored for the following issues for this high-risk pregnancy and has History of gestational diabetes; Family planning advice; Acne vulgaris; Supervision of high risk pregnancy, antepartum; History of deep vein thrombosis (DVT) during pregnancy; History of cesarean section, low transverse; and Family history of breast cancer on their problem list.  Patient reports no complaints.  Contractions: Not present. Vag. Bleeding: None.  Movement: Present. Denies leaking of fluid.   The following portions of the patient's history were reviewed and updated as appropriate: allergies, current medications, past family history, past medical history, past social history, past surgical history and problem list.   Objective:   Vitals:   11/20/19 1042  Weight: 205 lb 3.2 oz (93.1 kg)    Fetal Status:     Movement: Present     General:  Alert, oriented and cooperative. Patient is in no acute distress.  Skin: Skin is warm and dry. No rash noted.   Cardiovascular: Normal heart rate noted  Respiratory: Normal respiratory effort, no problems with respiration noted  Abdomen: Soft, gravid, appropriate for gestational age.  Pain/Pressure: Absent     Pelvic: Cervical exam deferred        Extremities: Normal range of motion.  Edema: None  Mental Status: Normal mood and affect. Normal behavior. Normal judgment and thought content.   Assessment and Plan:  Pregnancy: [id number] at [redacted]w[redacted]d 1. Supervision of high risk pregnancy, antepartum 28 wk labs next visit - Tdap vaccine greater than or equal to 7yo IM  2. History of deep vein thrombosis (DVT) during pregnancy Importance of lovenox reviewed, she will consider resuming taking this.  3. History of cesarean section, low transverse Desires TOLAC if possible, will need to sign papers Considering BTL as well since  contraceptive methods have not agreed with her. Options reviewed. She will consider.  4. Family history of breast cancer Will need early screening Advised mom to consider genetic testing  Preterm labor symptoms and general obstetric precautions including but not limited to vaginal bleeding, contractions, leaking of fluid and fetal movement were reviewed in detail with the patient. Please refer to After Visit Summary for other counseling recommendations.   Return in 2 weeks (on 12/04/2019) for [facility], needs MD, in person, 28 wk labs.  Future Appointments  Date Time Provider Department Center  12/04/2019 10:15 AM Reva Bores, MD [facility]  12/06/2019  2:45 PM [facility] NURSE [facility]  12/06/2019  3:00 PM [facility] US1 [facility]    Reva Bores, MD

## 2019-12-04 ENCOUNTER — Other Ambulatory Visit: Payer: Self-pay

## 2019-12-04 ENCOUNTER — Ambulatory Visit (INDEPENDENT_AMBULATORY_CARE_PROVIDER_SITE_OTHER): Payer: Medicaid Other | Admitting: Family Medicine

## 2019-12-04 VITALS — BP 115/84 | HR 98 | Wt 206.0 lb

## 2019-12-04 DIAGNOSIS — Z98891 History of uterine scar from previous surgery: Secondary | ICD-10-CM

## 2019-12-04 DIAGNOSIS — Z86718 Personal history of other venous thrombosis and embolism: Secondary | ICD-10-CM

## 2019-12-04 DIAGNOSIS — Z8759 Personal history of other complications of pregnancy, childbirth and the puerperium: Secondary | ICD-10-CM

## 2019-12-04 DIAGNOSIS — Z803 Family history of malignant neoplasm of breast: Secondary | ICD-10-CM

## 2019-12-04 DIAGNOSIS — O0990 Supervision of high risk pregnancy, unspecified, unspecified trimester: Secondary | ICD-10-CM

## 2019-12-05 NOTE — Progress Notes (Signed)
   PRENATAL VISIT NOTE  Subjective:  Marie Wade is a 29 y.o. [phone] at [redacted]w[redacted]d being seen today for ongoing prenatal care.  She is currently monitored for the following issues for this high-risk pregnancy and has History of gestational diabetes; Family planning advice; Acne vulgaris; Supervision of high risk pregnancy, antepartum; History of deep vein thrombosis (DVT) during pregnancy; History of cesarean section, low transverse; and Family history of breast cancer on their problem list.  Patient reports no complaints.  Contractions: Not present. Vag. Bleeding: None.  Movement: Present. Denies leaking of fluid.   The following portions of the patient's history were reviewed and updated as appropriate: allergies, current medications, past family history, past medical history, past social history, past surgical history and problem list.   Objective:   Vitals:   12/04/19 1036  BP: 115/84  Pulse: 98  Weight: 206 lb (93.4 kg)    Fetal Status:     Movement: Present     General:  Alert, oriented and cooperative. Patient is in no acute distress.  Skin: Skin is warm and dry. No rash noted.   Cardiovascular: Normal heart rate noted  Respiratory: Normal respiratory effort, no problems with respiration noted  Abdomen: Soft, gravid, appropriate for gestational age.  Pain/Pressure: Absent     Pelvic: Cervical exam deferred        Extremities: Normal range of motion.     Mental Status: Normal mood and affect. Normal behavior. Normal judgment and thought content.   Assessment and Plan:  Pregnancy: [id number] at [redacted]w[redacted]d 1. Family history of breast cancer Mom needs to consider testing  2. Supervision of high risk pregnancy, antepartum Still needs 28 wk labs--will do next time, has daughter with her today  3. History of deep vein thrombosis (DVT) during pregnancy On Lovenox  4. History of cesarean section, low transverse Desires VBAC  Preterm labor symptoms and general obstetric precautions  including but not limited to vaginal bleeding, contractions, leaking of fluid and fetal movement were reviewed in detail with the patient. Please refer to After Visit Summary for other counseling recommendations.   Return in about 2 weeks (around 12/18/2019) for 28 wk labs, [facility].  Future Appointments  Date Time Provider Department Center  12/06/2019  2:45 PM [facility] NURSE [facility]  12/06/2019  3:00 PM [facility] US1 [facility]  12/18/2019  8:20 AM [facility] LAB [facility]  12/18/2019 10:35 AM Warden Fillers, MD [facility]    Reva Bores, MD

## 2019-12-06 ENCOUNTER — Encounter: Payer: Self-pay | Admitting: *Deleted

## 2019-12-06 ENCOUNTER — Other Ambulatory Visit: Payer: Self-pay

## 2019-12-06 ENCOUNTER — Ambulatory Visit: Payer: Medicaid Other | Admitting: *Deleted

## 2019-12-06 ENCOUNTER — Ambulatory Visit: Payer: Medicaid Other | Attending: Obstetrics and Gynecology

## 2019-12-06 DIAGNOSIS — Z86718 Personal history of other venous thrombosis and embolism: Secondary | ICD-10-CM | POA: Diagnosis not present

## 2019-12-06 DIAGNOSIS — Z148 Genetic carrier of other disease: Secondary | ICD-10-CM | POA: Diagnosis not present

## 2019-12-06 DIAGNOSIS — O99013 Anemia complicating pregnancy, third trimester: Secondary | ICD-10-CM

## 2019-12-06 DIAGNOSIS — O34219 Maternal care for unspecified type scar from previous cesarean delivery: Secondary | ICD-10-CM

## 2019-12-06 DIAGNOSIS — O0990 Supervision of high risk pregnancy, unspecified, unspecified trimester: Secondary | ICD-10-CM | POA: Insufficient documentation

## 2019-12-06 DIAGNOSIS — Z362 Encounter for other antenatal screening follow-up: Secondary | ICD-10-CM

## 2019-12-06 DIAGNOSIS — Z3A28 28 weeks gestation of pregnancy: Secondary | ICD-10-CM

## 2019-12-06 DIAGNOSIS — Z8759 Personal history of other complications of pregnancy, childbirth and the puerperium: Secondary | ICD-10-CM

## 2019-12-06 DIAGNOSIS — Z803 Family history of malignant neoplasm of breast: Secondary | ICD-10-CM

## 2019-12-06 DIAGNOSIS — D649 Anemia, unspecified: Secondary | ICD-10-CM

## 2019-12-07 ENCOUNTER — Other Ambulatory Visit: Payer: Self-pay | Admitting: *Deleted

## 2019-12-07 DIAGNOSIS — Z362 Encounter for other antenatal screening follow-up: Secondary | ICD-10-CM

## 2019-12-12 ENCOUNTER — Other Ambulatory Visit: Payer: Self-pay

## 2019-12-12 DIAGNOSIS — O0990 Supervision of high risk pregnancy, unspecified, unspecified trimester: Secondary | ICD-10-CM

## 2019-12-18 ENCOUNTER — Ambulatory Visit (INDEPENDENT_AMBULATORY_CARE_PROVIDER_SITE_OTHER): Payer: Medicaid Other | Admitting: Obstetrics and Gynecology

## 2019-12-18 ENCOUNTER — Other Ambulatory Visit: Payer: Medicaid Other

## 2019-12-18 ENCOUNTER — Other Ambulatory Visit: Payer: Self-pay

## 2019-12-18 VITALS — BP 126/73 | HR 86 | Wt 204.0 lb

## 2019-12-18 DIAGNOSIS — O0990 Supervision of high risk pregnancy, unspecified, unspecified trimester: Secondary | ICD-10-CM | POA: Diagnosis not present

## 2019-12-18 DIAGNOSIS — Z98891 History of uterine scar from previous surgery: Secondary | ICD-10-CM

## 2019-12-18 DIAGNOSIS — Z8632 Personal history of gestational diabetes: Secondary | ICD-10-CM

## 2019-12-18 DIAGNOSIS — Z803 Family history of malignant neoplasm of breast: Secondary | ICD-10-CM

## 2019-12-18 DIAGNOSIS — Z86718 Personal history of other venous thrombosis and embolism: Secondary | ICD-10-CM

## 2019-12-18 DIAGNOSIS — Z8759 Personal history of other complications of pregnancy, childbirth and the puerperium: Secondary | ICD-10-CM

## 2019-12-18 DIAGNOSIS — Z3A30 30 weeks gestation of pregnancy: Secondary | ICD-10-CM

## 2019-12-18 NOTE — Progress Notes (Signed)
Faculty Practice OB/GYN Attending Consult Note  29 y.o. [phone] at [redacted]w[redacted]d with Estimated Date of Delivery: 02/25/20 was seen today in office to discuss trial of labor after cesarean section (TOLAC) versus elective repeat cesarean delivery (ERCD). The following risks were discussed with the patient.  Risk of uterine rupture at term is 0.78 percent with TOLAC and 0.22 percent with ERCD. 1 in 10 uterine ruptures will result in neonatal death or neurological injury. The benefits of a trial of labor after cesarean (TOLAC) resulting in a vaginal birth after cesarean (VBAC) include the following: shorter length of hospital stay and postpartum recovery (in most cases); fewer complications, such as postpartum fever, wound or uterine infection, thromboembolism (blood clots in the leg or lung), need for blood transfusion and fewer neonatal breathing problems. The risks of an attempted VBAC or TOLAC include the following: . Risk of failed trial of labor after cesarean (TOLAC) without a vaginal birth after cesarean (VBAC) resulting in repeat cesarean delivery (RCD) in about 20 to 40 percent of women who attempt VBAC.  Marland Kitchen Risk of rupture of uterus resulting in an emergency cesarean delivery. The risk of uterine rupture may be related in part to the type of uterine incision made during the first cesarean delivery. A previous transverse uterine incision has the lowest risk of rupture (0.2 to 1.5 percent risk). Vertical or T-shaped uterine incisions have a higher risk of uterine rupture (4 to 9 percent risk)The risk of fetal death is very low with both VBAC and elective repeat cesarean delivery (ERCD), but the likelihood of fetal death is higher with VBAC than with ERCD. Maternal death is very rare with either type of delivery. The risks of an elective repeat cesarean delivery (ERCD) were reviewed with the patient including but not limited to: 03/998 risk of uterine rupture which could have serious consequences, bleeding which  may require transfusion; infection which may require antibiotics; injury to bowel, bladder or other surrounding organs (bowel, bladder, ureters); injury to the fetus; need for additional procedures including hysterectomy in the event of a life-threatening hemorrhage; thromboembolic phenomenon; abnormal placentation; incisional problems; death and other postoperative or anesthesia complications.    These risks and benefits are summarized on the consent form, which was reviewed with the patient during the visit.  All her questions answered and she signed a consent indicating a preference for TOLAC/ERCD. A copy of the consent was given to the patient.   Will continue routine postpartum care.  [name], MD, FACOG Obstetrician & Gynecologist, [facility] for [facility], [facility]

## 2019-12-18 NOTE — Progress Notes (Signed)
   PRENATAL VISIT NOTE  Subjective:  Marie Wade is a 29 y.o. [phone] at [redacted]w[redacted]d being seen today for ongoing prenatal care.  She is currently monitored for the following issues for this high-risk pregnancy and has History of gestational diabetes; Family planning advice; Acne vulgaris; Supervision of high risk pregnancy, antepartum; History of deep vein thrombosis (DVT) during pregnancy; History of cesarean section, low transverse; Family history of breast cancer; and [redacted] weeks gestation of pregnancy on their problem list.  Patient doing well with no acute concerns today. She reports no complaints.  Contractions: Not present. Vag. Bleeding: None.  Movement: Present. Denies leaking of fluid.   The following portions of the patient's history were reviewed and updated as appropriate: allergies, current medications, past family history, past medical history, past social history, past surgical history and problem list. Problem list updated.  Objective:   Vitals:   12/18/19 0904  BP: 126/73  Pulse: 86  Weight: 204 lb (92.5 kg)    Fetal Status: Fetal Heart Rate (bpm): 144   Movement: Present     General:  Alert, oriented and cooperative. Patient is in no acute distress.  Skin: Skin is warm and dry. No rash noted.   Cardiovascular: Normal heart rate noted  Respiratory: Normal respiratory effort, no problems with respiration noted  Abdomen: Soft, gravid, appropriate for gestational age.  Pain/Pressure: Absent     Pelvic: Cervical exam deferred        Extremities: Normal range of motion.  Edema: None  Mental Status:  Normal mood and affect. Normal behavior. Normal judgment and thought content.   Assessment and Plan:  Pregnancy: [id number] at [redacted]w[redacted]d  1. Family history of breast cancer   2. Supervision of high risk pregnancy, antepartum   3. History of deep vein thrombosis (DVT) during pregnancy Pt continues prophylactic lovenox  4. History of gestational diabetes 2 hour GTT today  5.  History of cesarean section, low transverse Pt desires TOLAC, please see separate note  6. [redacted] weeks gestation of pregnancy   Preterm labor symptoms and general obstetric precautions including but not limited to vaginal bleeding, contractions, leaking of fluid and fetal movement were reviewed in detail with the patient.  Please refer to After Visit Summary for other counseling recommendations.   Return in about 2 weeks (around 01/01/2020).   [name], MD

## 2019-12-18 NOTE — Patient Instructions (Signed)
Vaginal Birth After Cesarean Delivery  Vaginal birth after cesarean delivery (VBAC) is giving birth vaginally after previously delivering a baby through a cesarean section (C-section). A VBAC may be a safe option for you, depending on your health and other factors. It is important to discuss VBAC with your health care provider early in your pregnancy so you can understand the risks, benefits, and options. Having these discussions early will give you time to make your birth plan. Who are the best candidates for VBAC? The best candidates for VBAC are women who:  Have had one or two prior cesarean deliveries, and the incision made during the delivery was horizontal (low transverse).  Do not have a vertical (classical) scar on their uterus.  Have not had a tear in the wall of their uterus (uterine rupture).  Plan to have more pregnancies. A VBAC is also more likely to be successful:  In women who have previously given birth vaginally.  When labor starts by itself (spontaneously) before the due date. What are the benefits of VBAC? The benefits of delivering your baby vaginally instead of by a cesarean delivery include:  A shorter hospital stay.  A faster recovery time.  Less pain.  Avoiding risks associated with major surgery, such as infection and blood clots.  Less blood loss and less need for donated blood (transfusions). What are the risks of VBAC? The main risk of attempting a VBAC is that it may fail, forcing your health care provider to deliver your baby by a C-section. Other risks are rare and include:  Tearing (rupture) of the scar from a past cesarean delivery.  Other risks associated with vaginal deliveries. If a repeat cesarean delivery is needed, the risks include:  Blood loss.  Infection.  Blood clot.  Damage to surrounding organs.  Removal of the uterus (hysterectomy), if it is damaged.  Placenta problems in future pregnancies. What else should I know  about my options? Delivering a baby through a VBAC is similar to having a normal spontaneous vaginal delivery. Therefore, it is safe:  To try with twins.  For your health care provider to try to turn the baby from a breech position (external cephalic version) during labor.  With epidural analgesia for pain relief. Consider where you would like to deliver your baby. VBAC should be attempted in facilities where an emergency cesarean delivery can be performed. VBAC is not recommended for home births. Any changes in your health or your baby's health during your pregnancy may make it necessary to change your initial decision about VBAC. Your health care provider may recommend that you do not attempt a VBAC if:  Your baby's suspected weight is 8.8 lb (4 kg) or more.  You have preeclampsia. This is a condition that causes high blood pressure along with other symptoms, such as swelling and headaches.  You will have VBAC less than 19 months after your cesarean delivery.  You are past your due date.  You need to have labor started (induced) because your cervix is not ready for labor (unfavorable). Where to find more information  American Pregnancy Association: americanpregnancy.org  American Congress of Obstetricians and Gynecologists: acog.org Summary  Vaginal birth after cesarean delivery (VBAC) is giving birth vaginally after previously delivering a baby through a cesarean section (C-section). A VBAC may be a safe option for you, depending on your health and other factors.  Discuss VBAC with your health care provider early in your pregnancy so you can understand the risks, benefits, options, and   have plenty of time to make your birth plan.  The main risk of attempting a VBAC is that it may fail, forcing your health care provider to deliver your baby by a C-section. Other risks are rare. This information is not intended to replace advice given to you by your health care provider. Make sure  you discuss any questions you have with your health care provider. Document Revised: 05/17/2018 Document Reviewed: 04/28/2016 Elsevier Patient Education  2020 Elsevier Inc.  

## 2019-12-19 LAB — CBC
Hematocrit: 31.2 % — ABNORMAL LOW (ref 34.0–46.6)
Hemoglobin: 9.6 g/dL — ABNORMAL LOW (ref 11.1–15.9)
MCH: 24.7 pg — ABNORMAL LOW (ref 26.6–33.0)
MCHC: 30.8 g/dL — ABNORMAL LOW (ref 31.5–35.7)
MCV: 80 fL (ref 79–97)
Platelets: 215 10*3/uL (ref 150–450)
RBC: 3.88 x10E6/uL (ref 3.77–5.28)
RDW: 15 % (ref 11.7–15.4)
WBC: 6.8 10*3/uL (ref 3.4–10.8)

## 2019-12-19 LAB — GLUCOSE TOLERANCE, 2 HOURS W/ 1HR
Glucose, 1 hour: 139 mg/dL (ref 65–179)
Glucose, 2 hour: 125 mg/dL (ref 65–152)
Glucose, Fasting: 72 mg/dL (ref 65–91)

## 2019-12-19 LAB — RPR: RPR Ser Ql: NONREACTIVE

## 2019-12-19 LAB — HIV ANTIBODY (ROUTINE TESTING W REFLEX): HIV Screen 4th Generation wRfx: NONREACTIVE

## 2019-12-20 ENCOUNTER — Encounter: Payer: Self-pay | Admitting: *Deleted

## 2020-01-08 ENCOUNTER — Encounter: Payer: Self-pay | Admitting: Obstetrics and Gynecology

## 2020-01-08 ENCOUNTER — Other Ambulatory Visit: Payer: Self-pay

## 2020-01-08 ENCOUNTER — Ambulatory Visit (INDEPENDENT_AMBULATORY_CARE_PROVIDER_SITE_OTHER): Payer: Medicaid Other | Admitting: Obstetrics and Gynecology

## 2020-01-08 VITALS — BP 111/71 | HR 104 | Wt 204.3 lb

## 2020-01-08 DIAGNOSIS — O99013 Anemia complicating pregnancy, third trimester: Secondary | ICD-10-CM

## 2020-01-08 DIAGNOSIS — Z148 Genetic carrier of other disease: Secondary | ICD-10-CM | POA: Insufficient documentation

## 2020-01-08 DIAGNOSIS — O09899 Supervision of other high risk pregnancies, unspecified trimester: Secondary | ICD-10-CM

## 2020-01-08 DIAGNOSIS — Z86718 Personal history of other venous thrombosis and embolism: Secondary | ICD-10-CM

## 2020-01-08 DIAGNOSIS — Z8759 Personal history of other complications of pregnancy, childbirth and the puerperium: Secondary | ICD-10-CM

## 2020-01-08 DIAGNOSIS — Z98891 History of uterine scar from previous surgery: Secondary | ICD-10-CM

## 2020-01-08 DIAGNOSIS — O0990 Supervision of high risk pregnancy, unspecified, unspecified trimester: Secondary | ICD-10-CM

## 2020-01-08 DIAGNOSIS — Z3A33 33 weeks gestation of pregnancy: Secondary | ICD-10-CM

## 2020-01-08 NOTE — Progress Notes (Signed)
Prenatal Visit Note Date: 01/08/2020 [facility]  Subjective:  Marie Wade is a 29 y.o. [id number] at [redacted]w[redacted]d being seen today for ongoing prenatal care.  She is currently monitored for the following issues for this high-risk pregnancy and has Family planning advice; Acne vulgaris; Supervision of high risk pregnancy, antepartum; History of deep vein thrombosis (DVT) during pregnancy; History of cesarean section, low transverse; Family history of breast cancer; Genetic carrier; and Short interval between pregnancies affecting pregnancy, antepartum on their problem list.  Patient reports no complaints.   Contractions: Irritability. Vag. Bleeding: None.  Movement: Present. Denies leaking of fluid.   The following portions of the patient's history were reviewed and updated as appropriate: allergies, current medications, past family history, past medical history, past social history, past surgical history and problem list. Problem list updated.  Objective:   Vitals:   01/08/20 0927  BP: 111/71  Pulse: (!) 104  Weight: 204 lb 4.8 oz (92.7 kg)    Fetal Status: Fetal Heart Rate (bpm): 140   Movement: Present     General:  Alert, oriented and cooperative. Patient is in no acute distress.  Skin: Skin is warm and dry. No rash noted.   Cardiovascular: Normal heart rate noted  Respiratory: Normal respiratory effort, no problems with respiration noted  Abdomen: Soft, gravid, appropriate for gestational age. Pain/Pressure: Absent     Pelvic:  Cervical exam deferred        Extremities: Normal range of motion.  Edema: None  Mental Status: Normal mood and affect. Normal behavior. Normal judgment and thought content.   Urinalysis:      Assessment and Plan:  Pregnancy: [id number] at [redacted]w[redacted]d  1. Supervision of high risk pregnancy, antepartum Routine care. Paragard  2. Anemia during pregnancy in third trimester Pt states taking qday iron and rationale for this given. See  below. Confirm if taking more consistently nv. If not, offer iv iron  3. Short interval between pregnancies affecting pregnancy, antepartum  4. History of cesarean section, low transverse Desires tolac. Consent already signed  5. History of deep vein thrombosis (DVT) during pregnancy She states she hasn't taken her qday lovenox for several months. Rationale for the lovenox given and risk of PE, MI, death, fetal issues, etc d/w her and she understands this. ED precautions given. Still will need 39wk IOL and d/c her lovenox 12 to 24 hours prior to IOL.   11/3 growth u/s 83%, 1435gm, ac 90%, normal afi. Has rpt growth on 12/15  6. [redacted] weeks gestation of pregnancy  Preterm labor symptoms and general obstetric precautions including but not limited to vaginal bleeding, contractions, leaking of fluid and fetal movement were reviewed in detail with the patient. Please refer to After Visit Summary for other counseling recommendations.  Return in about 9 days (around 01/17/2020) for in person, md or app.   [place], MD

## 2020-01-17 ENCOUNTER — Ambulatory Visit: Payer: Medicaid Other | Attending: Obstetrics and Gynecology

## 2020-01-17 ENCOUNTER — Other Ambulatory Visit: Payer: Self-pay

## 2020-01-17 ENCOUNTER — Encounter: Payer: Self-pay | Admitting: *Deleted

## 2020-01-17 ENCOUNTER — Ambulatory Visit: Payer: Medicaid Other | Admitting: *Deleted

## 2020-01-17 DIAGNOSIS — O0990 Supervision of high risk pregnancy, unspecified, unspecified trimester: Secondary | ICD-10-CM | POA: Insufficient documentation

## 2020-01-17 DIAGNOSIS — Z3A34 34 weeks gestation of pregnancy: Secondary | ICD-10-CM

## 2020-01-17 DIAGNOSIS — Z8759 Personal history of other complications of pregnancy, childbirth and the puerperium: Secondary | ICD-10-CM | POA: Diagnosis not present

## 2020-01-17 DIAGNOSIS — D649 Anemia, unspecified: Secondary | ICD-10-CM | POA: Diagnosis not present

## 2020-01-17 DIAGNOSIS — O2233 Deep phlebothrombosis in pregnancy, third trimester: Secondary | ICD-10-CM | POA: Diagnosis not present

## 2020-01-17 DIAGNOSIS — Z362 Encounter for other antenatal screening follow-up: Secondary | ICD-10-CM | POA: Diagnosis not present

## 2020-01-17 DIAGNOSIS — O34219 Maternal care for unspecified type scar from previous cesarean delivery: Secondary | ICD-10-CM | POA: Diagnosis not present

## 2020-01-17 DIAGNOSIS — Z148 Genetic carrier of other disease: Secondary | ICD-10-CM

## 2020-01-17 DIAGNOSIS — Z803 Family history of malignant neoplasm of breast: Secondary | ICD-10-CM | POA: Insufficient documentation

## 2020-01-17 DIAGNOSIS — O99013 Anemia complicating pregnancy, third trimester: Secondary | ICD-10-CM

## 2020-01-17 DIAGNOSIS — Z86718 Personal history of other venous thrombosis and embolism: Secondary | ICD-10-CM | POA: Insufficient documentation

## 2020-01-19 ENCOUNTER — Ambulatory Visit (INDEPENDENT_AMBULATORY_CARE_PROVIDER_SITE_OTHER): Payer: Medicaid Other | Admitting: Certified Nurse Midwife

## 2020-01-19 ENCOUNTER — Encounter: Payer: Self-pay | Admitting: Certified Nurse Midwife

## 2020-01-19 ENCOUNTER — Other Ambulatory Visit: Payer: Self-pay

## 2020-01-19 VITALS — BP 117/75 | HR 111 | Wt 201.3 lb

## 2020-01-19 DIAGNOSIS — Z98891 History of uterine scar from previous surgery: Secondary | ICD-10-CM

## 2020-01-19 DIAGNOSIS — O0990 Supervision of high risk pregnancy, unspecified, unspecified trimester: Secondary | ICD-10-CM

## 2020-01-19 DIAGNOSIS — Z3A34 34 weeks gestation of pregnancy: Secondary | ICD-10-CM

## 2020-01-19 DIAGNOSIS — Z8759 Personal history of other complications of pregnancy, childbirth and the puerperium: Secondary | ICD-10-CM

## 2020-01-19 DIAGNOSIS — Z86718 Personal history of other venous thrombosis and embolism: Secondary | ICD-10-CM

## 2020-01-19 DIAGNOSIS — O99013 Anemia complicating pregnancy, third trimester: Secondary | ICD-10-CM

## 2020-01-19 NOTE — Patient Instructions (Signed)

## 2020-01-19 NOTE — Progress Notes (Signed)
   PRENATAL VISIT NOTE  Subjective:  Marie Wade is a 29 y.o. [phone] at [redacted]w[redacted]d being seen today for ongoing prenatal care.  She is currently monitored for the following issues for this high-risk pregnancy and has Family planning advice; Acne vulgaris; Supervision of high risk pregnancy, antepartum; History of deep vein thrombosis (DVT) during pregnancy; History of cesarean section, low transverse; Family history of breast cancer; Genetic carrier; Short interval between pregnancies affecting pregnancy, antepartum; and Anemia during pregnancy in third trimester on their problem list.  Patient reports no complaints.  Contractions: Not present. Vag. Bleeding: None.  Movement: Present. Denies leaking of fluid.   The following portions of the patient's history were reviewed and updated as appropriate: allergies, current medications, past family history, past medical history, past social history, past surgical history and problem list.   Objective:   Vitals:   01/19/20 1024  BP: 117/75  Pulse: (!) 111  Weight: 201 lb 4.8 oz (91.3 kg)    Fetal Status: Fetal Heart Rate (bpm): 142 Fundal Height: 32 cm Movement: Present     General:  Alert, oriented and cooperative. Patient is in no acute distress.  Skin: Skin is warm and dry. No rash noted.   Cardiovascular: Normal heart rate noted  Respiratory: Normal respiratory effort, no problems with respiration noted  Abdomen: Soft, gravid, appropriate for gestational age.  Pain/Pressure: Absent     Pelvic: Cervical exam deferred        Extremities: Normal range of motion.  Edema: None  Mental Status: Normal mood and affect. Normal behavior. Normal judgment and thought content.   Assessment and Plan:  Pregnancy: [id number] at [redacted]w[redacted]d 1. Supervision of high risk pregnancy, antepartum - Patient doing well, no complaints  - Routine prenatal care - Anticipatory guidance on upcoming appointments with next being GBS testing, recommends antibiotics during labor  if GBS positive, patient verbalizes understanding.   2. History of deep vein thrombosis (DVT) during pregnancy - Patient reports that she is currently not taking Lovenox, educated and discussed risk of not taking lovenox and risk of PE, MI, stroke, death, fetal issues. Patient verbalizes understanding and plans to start taking again today and continue through the rest of the pregnancy.  - Discussed with patient continued plan for IOL at 39 weeks    3. History of cesarean section, low transverse - Patient nervous about induction due to last induction ending up in C/S d/t breech presentation. Discussed with patient that we will continue to check fetal presentation throughout induction and labor  - Plans TOLAC, consent under media   4. [redacted] weeks gestation of pregnancy  5. Anemia during pregnancy in third trimester - Patient reports that she is taking iron supplementation, plan to check repeat CBC at next visit to determine need for IV infusion   Preterm labor symptoms and general obstetric precautions including but not limited to vaginal bleeding, contractions, leaking of fluid and fetal movement were reviewed in detail with the patient. Please refer to After Visit Summary for other counseling recommendations.   Return in about 10 days (around 01/29/2020) for HROB, in person, can be with APP or MD , GBS.  Future Appointments  Date Time Provider Department Center  01/29/2020  3:55 PM Jerene Bears, MD [facility]    Sharyon Cable, CNM

## 2020-01-29 ENCOUNTER — Other Ambulatory Visit: Payer: Self-pay

## 2020-01-29 ENCOUNTER — Ambulatory Visit (INDEPENDENT_AMBULATORY_CARE_PROVIDER_SITE_OTHER): Payer: Medicaid Other | Admitting: Obstetrics & Gynecology

## 2020-01-29 ENCOUNTER — Other Ambulatory Visit (HOSPITAL_COMMUNITY)
Admission: RE | Admit: 2020-01-29 | Discharge: 2020-01-29 | Disposition: A | Discharge status: Home / Self Care | Payer: Medicaid Other | Source: Ambulatory Visit | Attending: Obstetrics & Gynecology | Admitting: Obstetrics & Gynecology

## 2020-01-29 VITALS — BP 101/69 | HR 80 | Wt 204.0 lb

## 2020-01-29 DIAGNOSIS — O0990 Supervision of high risk pregnancy, unspecified, unspecified trimester: Secondary | ICD-10-CM

## 2020-01-29 LAB — OB RESULTS CONSOLE GC/CHLAMYDIA: Gonorrhea: NEGATIVE

## 2020-01-29 NOTE — Progress Notes (Signed)
   PRENATAL VISIT NOTE  Subjective:  Marie Wade is a 29 y.o. [phone] at [redacted]w[redacted]d being seen today for ongoing prenatal care.  She is currently monitored for the following issues for this low-risk pregnancy and has Family planning advice; Acne vulgaris; Supervision of high risk pregnancy, antepartum; History of deep vein thrombosis (DVT) during pregnancy; History of cesarean section, low transverse; Family history of breast cancer; Genetic carrier; Short interval between pregnancies affecting pregnancy, antepartum; and Anemia during pregnancy in third trimester on their problem list.  Patient reports occasional contractions.  Contractions: Irregular. Vag. Bleeding: None.  Movement: Present. Denies leaking of fluid.   The following portions of the patient's history were reviewed and updated as appropriate: allergies, current medications, past family history, past medical history, past social history, past surgical history and problem list.   Objective:   Vitals:   01/29/20 1633  BP: 101/69  Pulse: 80  Weight: 204 lb (92.5 kg)    Fetal Status: Fetal Heart Rate (bpm): 143   Movement: Present  Presentation: Vertex  General:  Alert, oriented and cooperative. Patient is in no acute distress.  Skin: Skin is warm and dry. No rash noted.   Cardiovascular: Normal heart rate noted  Respiratory: Normal respiratory effort, no problems with respiration noted  Abdomen: Soft, gravid, appropriate for gestational age.  Pain/Pressure: Present     Pelvic: Cervical exam performed in the presence of a chaperone Dilation: 1.5 Effacement (%): 30 Station: -3  Extremities: Normal range of motion.  Edema: None  Mental Status: Normal mood and affect. Normal behavior. Normal judgment and thought content.   Assessment and Plan:  Pregnancy: [id number] at [redacted]w[redacted]d 1. Supervision of high risk pregnancy, antepartum Routine 36 weeks  - Culture, beta strep (group b only) - GC/Chlamydia probe amp ([facility])not at  [facility]  Preterm labor symptoms and general obstetric precautions including but not limited to vaginal bleeding, contractions, leaking of fluid and fetal movement were reviewed in detail with the patient. Please refer to After Visit Summary for other counseling recommendations.   Return in about 1 week (around 02/05/2020).  Future Appointments  Date Time Provider Department Center  02/05/2020  3:15 PM Hermina Staggers, MD [facility]    Scheryl Darter, MD

## 2020-01-29 NOTE — Patient Instructions (Signed)

## 2020-01-30 LAB — GC/CHLAMYDIA PROBE AMP (~~LOC~~) NOT AT ARMC
Chlamydia: NEGATIVE
Comment: NEGATIVE
Comment: NORMAL
Neisseria Gonorrhea: NEGATIVE

## 2020-02-01 LAB — CULTURE, BETA STREP (GROUP B ONLY): Strep Gp B Culture: POSITIVE — AB

## 2020-02-03 NOTE — L&D Delivery Note (Signed)
OB/GYN Faculty Practice Delivery Note  Marie Wade is a 30 y.o. [id number] s/p vaginal delivery at [redacted]w[redacted]d. She was admitted for    ROM: 3h 70m with clear fluid GBS Status: positive; adequate antibiotics prior to delivery Maximum Maternal Temperature: 98.74F  Labor Progress: On admission, FB was placed. Pt was then started on low dose pitocin with SROM for clear fluid at 1315. She then progressed to complete cervical dilation and had an uncomplicated delivery as noted below.  Delivery Date/Time: 02/18/20 at 1639 Delivery: Called to room and patient was complete and pushing. Head delivered LOA. Tight nuchal cord x1 present and reduced s/p delivery. Shoulder and body delivered in usual fashion. Infant with spontaneous cry, placed on mother's abdomen, dried and stimulated. Cord clamped x 2 after 1-minute delay, and cut by FOB under my direct supervision. Cord blood drawn. Placenta delivered spontaneously with gentle cord traction. Fundus firm with massage and Pitocin. Immediately s/p delivery of placenta pt had brisk bleeding from above. Given difficulty in visualizing cervix and initial rate of bleeding, Dr. Debroah Loop was called to the room but bleeding rapidly improved s/p lower uterine sweep with extraction of several clots in addition to administration of TXA, cytotec and methergine. Labia, perineum, vagina, and cervix (with use of speculum) were inspected, without evidence of lacerations. Fundus firm prior to placement of post-placental IUD as noted below.  Placenta: 3-vessel cord, intact, sent to L&D Complications: postpartum hemorrhage secondary to atony Lacerations: none EBL: 1495 ml Analgesia: IV fentanyl, epidural  Infant: female  APGARs 7 & 9  weight per medical record  Post-Placental IUD Insertion Procedure Note  Patient identified, informed consent signed prior to delivery, signed copy in chart, time out was performed.    Vaginal, labial and perineal areas thoroughly inspected for  lacerations. No lacerations noted but PPH was managed and bleeding well-controlled prior to Paraguard IUD placement.  IUD grasped between sterile gloved fingers. Sterile lubrication applied to sterile gloved hand for ease of insertion. Fundus identified through abdominal wall using non-insertion hand. IUD inserted to fundus with bimanual technique. IUD carefully released at the fundus and insertion hand gently removed from vagina. Strings were not trimmed prior to placement.  Patient tolerated procedure well.  Lot # [id number] Expiration Date: 05/2025  Patient given post procedure instructions and IUD care card with expiration date.  Patient is asked to keep IUD strings tucked in her vagina until her postpartum follow up visit in 4-6 weeks. Patient advised to abstain from sexual intercourse and pulling on strings before her follow-up visit. Patient verbalized an understanding of the plan of care and agrees.   Sheila Oats, MD OB Fellow, Faculty Practice 02/18/2020 5:44 PM

## 2020-02-05 ENCOUNTER — Encounter: Payer: Self-pay | Admitting: Obstetrics and Gynecology

## 2020-02-05 ENCOUNTER — Telehealth (HOSPITAL_COMMUNITY): Payer: Self-pay | Admitting: *Deleted

## 2020-02-05 ENCOUNTER — Other Ambulatory Visit: Payer: Self-pay

## 2020-02-05 ENCOUNTER — Ambulatory Visit (INDEPENDENT_AMBULATORY_CARE_PROVIDER_SITE_OTHER): Payer: Medicaid Other | Admitting: Obstetrics and Gynecology

## 2020-02-05 VITALS — BP 122/64 | HR 86 | Wt 204.7 lb

## 2020-02-05 DIAGNOSIS — Z86718 Personal history of other venous thrombosis and embolism: Secondary | ICD-10-CM

## 2020-02-05 DIAGNOSIS — Z8759 Personal history of other complications of pregnancy, childbirth and the puerperium: Secondary | ICD-10-CM

## 2020-02-05 DIAGNOSIS — Z803 Family history of malignant neoplasm of breast: Secondary | ICD-10-CM

## 2020-02-05 DIAGNOSIS — B951 Streptococcus, group B, as the cause of diseases classified elsewhere: Secondary | ICD-10-CM

## 2020-02-05 DIAGNOSIS — O0990 Supervision of high risk pregnancy, unspecified, unspecified trimester: Secondary | ICD-10-CM

## 2020-02-05 NOTE — Progress Notes (Signed)
IOL on 02/18/2020 in AM.

## 2020-02-05 NOTE — Telephone Encounter (Signed)
Preadmission screen  

## 2020-02-05 NOTE — Progress Notes (Signed)
Subjective:  Marie Wade is a 30 y.o. [id number] at [redacted]w[redacted]d being seen today for ongoing prenatal care.  She is currently monitored for the following issues for this high-risk pregnancy and has Family planning advice; Acne vulgaris; Supervision of high risk pregnancy, antepartum; History of deep vein thrombosis (DVT) during pregnancy; History of cesarean section, low transverse; Family history of breast cancer; Genetic carrier; Short interval between pregnancies affecting pregnancy, antepartum; Anemia during pregnancy in third trimester; and Positive GBS test on their problem list.  Patient reports general discomforts of pregnancy.  Contractions: Irritability. Vag. Bleeding: None.  Movement: Present. Denies leaking of fluid.   The following portions of the patient's history were reviewed and updated as appropriate: allergies, current medications, past family history, past medical history, past social history, past surgical history and problem list. Problem list updated.  Objective:   Vitals:   02/05/20 1524  BP: 122/64  Pulse: 86  Weight: 204 lb 11.2 oz (92.9 kg)    Fetal Status: Fetal Heart Rate (bpm): 135   Movement: Present     General:  Alert, oriented and cooperative. Patient is in no acute distress.  Skin: Skin is warm and dry. No rash noted.   Cardiovascular: Normal heart rate noted  Respiratory: Normal respiratory effort, no problems with respiration noted  Abdomen: Soft, gravid, appropriate for gestational age. Pain/Pressure: Present     Pelvic:  Cervical exam deferred        Extremities: Normal range of motion.  Edema: None  Mental Status: Normal mood and affect. Normal behavior. Normal judgment and thought content.   Urinalysis:      Assessment and Plan:  Pregnancy: [id number] at [redacted]w[redacted]d  1. Supervision of high risk pregnancy, antepartum Stable Labor precautions  2. History of deep vein thrombosis (DVT) during pregnancy Pt not taking Lovenox and has been counseled on  risks   3. Positive GBS test Reviewed with pt Treat while in labor  4. H/O C section Desires TOLAC and has been consented  Term labor symptoms and general obstetric precautions including but not limited to vaginal bleeding, contractions, leaking of fluid and fetal movement were reviewed in detail with the patient. Please refer to After Visit Summary for other counseling recommendations.  Return in about 1 week (around 02/12/2020) for face to face, MD only, OB visit.   Hermina Staggers, MD

## 2020-02-06 ENCOUNTER — Encounter: Payer: No Typology Code available for payment source | Admitting: Family Medicine

## 2020-02-06 ENCOUNTER — Telehealth (HOSPITAL_COMMUNITY): Payer: Self-pay | Admitting: *Deleted

## 2020-02-06 NOTE — Telephone Encounter (Signed)
Preadmission screen  

## 2020-02-07 ENCOUNTER — Telehealth (HOSPITAL_COMMUNITY): Payer: Self-pay | Admitting: *Deleted

## 2020-02-07 NOTE — Telephone Encounter (Signed)
Preadmission screen  

## 2020-02-08 ENCOUNTER — Telehealth (HOSPITAL_COMMUNITY): Payer: Self-pay | Admitting: *Deleted

## 2020-02-08 ENCOUNTER — Encounter (HOSPITAL_COMMUNITY): Payer: Self-pay | Admitting: *Deleted

## 2020-02-08 ENCOUNTER — Telehealth: Payer: Self-pay | Admitting: Lactation Services

## 2020-02-08 NOTE — Telephone Encounter (Signed)
Preadmission screen  

## 2020-02-08 NOTE — Telephone Encounter (Signed)
Letter created at patients request to give to school in regards to her induction date. My Chart message sent.

## 2020-02-12 ENCOUNTER — Other Ambulatory Visit: Payer: Self-pay

## 2020-02-12 ENCOUNTER — Ambulatory Visit (INDEPENDENT_AMBULATORY_CARE_PROVIDER_SITE_OTHER): Payer: Medicaid Other | Admitting: Obstetrics & Gynecology

## 2020-02-12 VITALS — BP 114/69 | HR 71 | Wt 205.8 lb

## 2020-02-12 DIAGNOSIS — O0990 Supervision of high risk pregnancy, unspecified, unspecified trimester: Secondary | ICD-10-CM

## 2020-02-12 DIAGNOSIS — O09899 Supervision of other high risk pregnancies, unspecified trimester: Secondary | ICD-10-CM

## 2020-02-12 DIAGNOSIS — B951 Streptococcus, group B, as the cause of diseases classified elsewhere: Secondary | ICD-10-CM

## 2020-02-12 DIAGNOSIS — Z98891 History of uterine scar from previous surgery: Secondary | ICD-10-CM

## 2020-02-12 NOTE — Patient Instructions (Signed)

## 2020-02-12 NOTE — Progress Notes (Signed)
   PRENATAL VISIT NOTE  Subjective:  Marie Wade is a 30 y.o. [phone] at [redacted]w[redacted]d being seen today for ongoing prenatal care.  She is currently monitored for the following issues for this high-risk pregnancy and has Family planning advice; Acne vulgaris; Supervision of high risk pregnancy, antepartum; History of deep vein thrombosis (DVT) during pregnancy; History of cesarean section, low transverse; Family history of breast cancer; Genetic carrier; Short interval between pregnancies affecting pregnancy, antepartum; Anemia during pregnancy in third trimester; and Positive GBS test on their problem list.  Patient reports occasional contractions.  Contractions: Irregular. Vag. Bleeding: None.  Movement: Present. Denies leaking of fluid.   The following portions of the patient's history were reviewed and updated as appropriate: allergies, current medications, past family history, past medical history, past social history, past surgical history and problem list.   Objective:   Vitals:   02/12/20 1604  BP: 114/69  Pulse: 71  Weight: 205 lb 12.8 oz (93.4 kg)    Fetal Status: Fetal Heart Rate (bpm): 135   Movement: Present     General:  Alert, oriented and cooperative. Patient is in no acute distress.  Skin: Skin is warm and dry. No rash noted.   Cardiovascular: Normal heart rate noted  Respiratory: Normal respiratory effort, no problems with respiration noted  Abdomen: Soft, gravid, appropriate for gestational age.  Pain/Pressure: Present     Pelvic: Cervical exam performed in the presence of a chaperone        Extremities: Normal range of motion.  Edema: None  Mental Status: Normal mood and affect. Normal behavior. Normal judgment and thought content.   Assessment and Plan:  Pregnancy: [id number] at [redacted]w[redacted]d 1. Supervision of high risk pregnancy, antepartum   2. Positive GBS test Prophylaxis in labor discussed  3. History of cesarean section, low transverse Plans TOLAC  4. Short  interval between pregnancies affecting pregnancy, antepartum  Term labor symptoms and general obstetric precautions including but not limited to vaginal bleeding, contractions, leaking of fluid and fetal movement were reviewed in detail with the patient. Please refer to After Visit Summary for other counseling recommendations.   Return in about 1 week (around 02/19/2020).  Future Appointments  Date Time Provider Department Center  02/16/2020  9:45 AM [facility]-SCREENING [facility] None  02/18/2020  7:00 AM [facility]-LD SCHED ROOM [facility] None    Scheryl Darter, MD

## 2020-02-14 ENCOUNTER — Other Ambulatory Visit: Payer: Self-pay | Admitting: Advanced Practice Midwife

## 2020-02-16 ENCOUNTER — Other Ambulatory Visit (HOSPITAL_COMMUNITY)
Admission: RE | Admit: 2020-02-16 | Discharge: 2020-02-16 | Disposition: A | Discharge status: Home / Self Care | Payer: Medicaid Other | Source: Ambulatory Visit | Attending: Obstetrics & Gynecology | Admitting: Obstetrics & Gynecology

## 2020-02-16 DIAGNOSIS — Z20822 Contact with and (suspected) exposure to covid-19: Secondary | ICD-10-CM | POA: Diagnosis not present

## 2020-02-16 DIAGNOSIS — Z01812 Encounter for preprocedural laboratory examination: Secondary | ICD-10-CM | POA: Insufficient documentation

## 2020-02-16 LAB — SARS CORONAVIRUS 2 (TAT 6-24 HRS): SARS Coronavirus 2: NEGATIVE

## 2020-02-18 ENCOUNTER — Encounter (HOSPITAL_COMMUNITY): Payer: Self-pay | Admitting: Obstetrics and Gynecology

## 2020-02-18 ENCOUNTER — Inpatient Hospital Stay (HOSPITAL_COMMUNITY): Payer: Medicaid Other | Admitting: Anesthesiology

## 2020-02-18 ENCOUNTER — Inpatient Hospital Stay (HOSPITAL_COMMUNITY)
Admission: AD | Admit: 2020-02-18 | Discharge: 2020-02-19 | DRG: 806 | Disposition: A | Discharge status: Home / Self Care | Payer: Medicaid Other | Attending: Obstetrics & Gynecology | Admitting: Obstetrics & Gynecology

## 2020-02-18 ENCOUNTER — Inpatient Hospital Stay (HOSPITAL_COMMUNITY): Payer: Medicaid Other

## 2020-02-18 ENCOUNTER — Other Ambulatory Visit: Payer: Self-pay

## 2020-02-18 DIAGNOSIS — O34211 Maternal care for low transverse scar from previous cesarean delivery: Secondary | ICD-10-CM | POA: Diagnosis present

## 2020-02-18 DIAGNOSIS — B951 Streptococcus, group B, as the cause of diseases classified elsewhere: Secondary | ICD-10-CM | POA: Diagnosis present

## 2020-02-18 DIAGNOSIS — O26893 Other specified pregnancy related conditions, third trimester: Principal | ICD-10-CM | POA: Diagnosis present

## 2020-02-18 DIAGNOSIS — Z3A39 39 weeks gestation of pregnancy: Secondary | ICD-10-CM

## 2020-02-18 DIAGNOSIS — Z86718 Personal history of other venous thrombosis and embolism: Secondary | ICD-10-CM | POA: Diagnosis not present

## 2020-02-18 DIAGNOSIS — Z98891 History of uterine scar from previous surgery: Secondary | ICD-10-CM

## 2020-02-18 DIAGNOSIS — O99824 Streptococcus B carrier state complicating childbirth: Secondary | ICD-10-CM | POA: Diagnosis not present

## 2020-02-18 DIAGNOSIS — O99013 Anemia complicating pregnancy, third trimester: Secondary | ICD-10-CM | POA: Diagnosis present

## 2020-02-18 DIAGNOSIS — Z148 Genetic carrier of other disease: Secondary | ICD-10-CM

## 2020-02-18 DIAGNOSIS — O0990 Supervision of high risk pregnancy, unspecified, unspecified trimester: Secondary | ICD-10-CM

## 2020-02-18 DIAGNOSIS — Z30430 Encounter for insertion of intrauterine contraceptive device: Secondary | ICD-10-CM | POA: Diagnosis not present

## 2020-02-18 DIAGNOSIS — Z331 Pregnant state, incidental: Secondary | ICD-10-CM

## 2020-02-18 DIAGNOSIS — D62 Acute posthemorrhagic anemia: Secondary | ICD-10-CM | POA: Diagnosis not present

## 2020-02-18 DIAGNOSIS — Z8759 Personal history of other complications of pregnancy, childbirth and the puerperium: Secondary | ICD-10-CM

## 2020-02-18 DIAGNOSIS — O9081 Anemia of the puerperium: Secondary | ICD-10-CM | POA: Diagnosis not present

## 2020-02-18 DIAGNOSIS — Z803 Family history of malignant neoplasm of breast: Secondary | ICD-10-CM

## 2020-02-18 DIAGNOSIS — O34219 Maternal care for unspecified type scar from previous cesarean delivery: Secondary | ICD-10-CM

## 2020-02-18 LAB — CBC
HCT: 30.6 % — ABNORMAL LOW (ref 36.0–46.0)
HCT: 27 % — ABNORMAL LOW (ref 36.0–46.0)
Hemoglobin: 9 g/dL — ABNORMAL LOW (ref 12.0–15.0)
Hemoglobin: 8 g/dL — ABNORMAL LOW (ref 12.0–15.0)
MCH: 22.7 pg — ABNORMAL LOW (ref 26.0–34.0)
MCH: 23.2 pg — ABNORMAL LOW (ref 26.0–34.0)
MCHC: 29.4 g/dL — ABNORMAL LOW (ref 30.0–36.0)
MCHC: 29.6 g/dL — ABNORMAL LOW (ref 30.0–36.0)
MCV: 77.3 fL — ABNORMAL LOW (ref 80.0–100.0)
MCV: 78.3 fL — ABNORMAL LOW (ref 80.0–100.0)
Platelets: 205 10*3/uL (ref 150–400)
Platelets: 175 10*3/uL (ref 150–400)
RBC: 3.96 MIL/uL (ref 3.87–5.11)
RBC: 3.45 MIL/uL — ABNORMAL LOW (ref 3.87–5.11)
RDW: 16 % — ABNORMAL HIGH (ref 11.5–15.5)
RDW: 16.2 % — ABNORMAL HIGH (ref 11.5–15.5)
WBC: 7.4 10*3/uL (ref 4.0–10.5)
WBC: 10.6 10*3/uL — ABNORMAL HIGH (ref 4.0–10.5)
nRBC: 0 % (ref 0.0–0.2)
nRBC: 0 % (ref 0.0–0.2)

## 2020-02-18 LAB — TYPE AND SCREEN
ABO/RH(D): B POS
Antibody Screen: NEGATIVE

## 2020-02-18 LAB — RPR: RPR Ser Ql: NONREACTIVE

## 2020-02-18 MED ORDER — LACTATED RINGERS IV BOLUS
1000.0000 mL | Freq: Once | INTRAVENOUS | Status: AC
  Administered 2020-02-18: 1000 mL via INTRAVENOUS

## 2020-02-18 MED ORDER — ONDANSETRON HCL 4 MG/2ML IJ SOLN: 4.0000 mg | INTRAMUSCULAR | Status: DC | PRN

## 2020-02-18 MED ORDER — PARAGARD INTRAUTERINE COPPER IU IUD
INTRAUTERINE_SYSTEM | Freq: Once | INTRAUTERINE | Status: AC
  Administered 2020-02-18: 1 via INTRAUTERINE
  Filled 2020-02-18: qty 1

## 2020-02-18 MED ORDER — HYDROXYZINE HCL 50 MG PO TABS: 50.0000 mg | ORAL_TABLET | Freq: Four times a day (QID) | ORAL | Status: DC | PRN

## 2020-02-18 MED ORDER — FENTANYL-BUPIVACAINE-NACL 0.5-0.125-0.9 MG/250ML-% EP SOLN: 12.0000 mL/h | EPIDURAL | Status: DC | PRN

## 2020-02-18 MED ORDER — SOD CITRATE-CITRIC ACID 500-334 MG/5ML PO SOLN: 30.0000 mL | ORAL | Status: DC | PRN

## 2020-02-18 MED ORDER — SIMETHICONE 80 MG PO CHEW: 80.0000 mg | CHEWABLE_TABLET | ORAL | Status: DC | PRN

## 2020-02-18 MED ORDER — TERBUTALINE SULFATE 1 MG/ML IJ SOLN: 0.2500 mg | Freq: Once | INTRAMUSCULAR | Status: DC | PRN

## 2020-02-18 MED ORDER — IBUPROFEN 600 MG PO TABS
600.0000 mg | ORAL_TABLET | Freq: Four times a day (QID) | ORAL | Status: DC
  Administered 2020-02-19 (×3): 600 mg via ORAL
  Filled 2020-02-18 (×4): qty 1

## 2020-02-18 MED ORDER — PENICILLIN G POT IN DEXTROSE 60000 UNIT/ML IV SOLN
3.0000 10*6.[IU] | INTRAVENOUS | Status: DC
  Administered 2020-02-18: 3 10*6.[IU] via INTRAVENOUS
  Filled 2020-02-18: qty 50

## 2020-02-18 MED ORDER — SENNOSIDES-DOCUSATE SODIUM 8.6-50 MG PO TABS
2.0000 | ORAL_TABLET | Freq: Every day | ORAL | Status: DC
  Administered 2020-02-19: 2 via ORAL
  Filled 2020-02-18: qty 2

## 2020-02-18 MED ORDER — TRANEXAMIC ACID-NACL 1000-0.7 MG/100ML-% IV SOLN: 1000.0000 mg | INTRAVENOUS | Status: DC

## 2020-02-18 MED ORDER — FENTANYL CITRATE (PF) 100 MCG/2ML IJ SOLN
50.0000 ug | INTRAMUSCULAR | Status: DC | PRN
  Administered 2020-02-18: 100 ug via INTRAVENOUS
  Filled 2020-02-18: qty 2

## 2020-02-18 MED ORDER — BENZOCAINE-MENTHOL 20-0.5 % EX AERO: 1.0000 "application " | INHALATION_SPRAY | CUTANEOUS | Status: DC | PRN

## 2020-02-18 MED ORDER — LACTATED RINGERS IV SOLN: 500.0000 mL | Freq: Once | INTRAVENOUS | Status: DC

## 2020-02-18 MED ORDER — DIPHENHYDRAMINE HCL 50 MG/ML IJ SOLN: 12.5000 mg | INTRAMUSCULAR | Status: DC | PRN

## 2020-02-18 MED ORDER — PHENYLEPHRINE 40 MCG/ML (10ML) SYRINGE FOR IV PUSH (FOR BLOOD PRESSURE SUPPORT): 80.0000 ug | PREFILLED_SYRINGE | INTRAVENOUS | Status: DC | PRN

## 2020-02-18 MED ORDER — ONDANSETRON HCL 4 MG/2ML IJ SOLN: 4.0000 mg | Freq: Four times a day (QID) | INTRAMUSCULAR | Status: DC | PRN

## 2020-02-18 MED ORDER — MISOPROSTOL 200 MCG PO TABS
1000.0000 ug | ORAL_TABLET | Freq: Once | ORAL | Status: AC
  Administered 2020-02-18: 1000 ug via RECTAL

## 2020-02-18 MED ORDER — OXYTOCIN-SODIUM CHLORIDE 30-0.9 UT/500ML-% IV SOLN
2.5000 [IU]/h | INTRAVENOUS | Status: DC
  Administered 2020-02-18: 2.5 [IU]/h via INTRAVENOUS

## 2020-02-18 MED ORDER — WITCH HAZEL-GLYCERIN EX PADS
1.0000 | MEDICATED_PAD | CUTANEOUS | Status: DC | PRN
Start: 2020-02-18 — End: 2020-02-19

## 2020-02-18 MED ORDER — ACETAMINOPHEN 325 MG PO TABS: 650.0000 mg | ORAL_TABLET | ORAL | Status: DC | PRN

## 2020-02-18 MED ORDER — OXYTOCIN-SODIUM CHLORIDE 30-0.9 UT/500ML-% IV SOLN
1.0000 m[IU]/min | INTRAVENOUS | Status: DC
  Administered 2020-02-18: 2 m[IU]/min via INTRAVENOUS
  Filled 2020-02-18: qty 500

## 2020-02-18 MED ORDER — METHYLERGONOVINE MALEATE 0.2 MG/ML IJ SOLN: 0.2000 mg | Freq: Once | INTRAMUSCULAR | Status: DC

## 2020-02-18 MED ORDER — METHYLERGONOVINE MALEATE 0.2 MG/ML IJ SOLN
INTRAMUSCULAR | Status: AC
  Administered 2020-02-18: 0.2 mg
  Filled 2020-02-18: qty 1

## 2020-02-18 MED ORDER — SODIUM CHLORIDE 0.9 % IV SOLN
5.0000 10*6.[IU] | Freq: Once | INTRAVENOUS | Status: AC
  Administered 2020-02-18: 5 10*6.[IU] via INTRAVENOUS
  Filled 2020-02-18: qty 5

## 2020-02-18 MED ORDER — LACTATED RINGERS IV SOLN: INTRAVENOUS | Status: DC

## 2020-02-18 MED ORDER — DIBUCAINE (PERIANAL) 1 % EX OINT: 1.0000 "application " | TOPICAL_OINTMENT | CUTANEOUS | Status: DC | PRN

## 2020-02-18 MED ORDER — SODIUM CHLORIDE 0.9 % IV SOLN
500.0000 mg | Freq: Once | INTRAVENOUS | Status: AC
  Administered 2020-02-18: 500 mg via INTRAVENOUS
  Filled 2020-02-18: qty 25

## 2020-02-18 MED ORDER — LIDOCAINE HCL (PF) 1 % IJ SOLN: 30.0000 mL | INTRAMUSCULAR | Status: DC | PRN

## 2020-02-18 MED ORDER — LIDOCAINE HCL (PF) 1 % IJ SOLN
INTRAMUSCULAR | Status: DC | PRN
  Administered 2020-02-18: 8 mL via EPIDURAL

## 2020-02-18 MED ORDER — MISOPROSTOL 200 MCG PO TABS
ORAL_TABLET | ORAL | Status: AC
  Filled 2020-02-18: qty 5

## 2020-02-18 MED ORDER — DIPHENHYDRAMINE HCL 25 MG PO CAPS: 25.0000 mg | ORAL_CAPSULE | Freq: Four times a day (QID) | ORAL | Status: DC | PRN

## 2020-02-18 MED ORDER — EPHEDRINE 5 MG/ML INJ: 10.0000 mg | INTRAVENOUS | Status: DC | PRN

## 2020-02-18 MED ORDER — OXYTOCIN BOLUS FROM INFUSION
333.0000 mL | Freq: Once | INTRAVENOUS | Status: AC
  Administered 2020-02-18: 333 mL via INTRAVENOUS

## 2020-02-18 MED ORDER — TETANUS-DIPHTH-ACELL PERTUSSIS 5-2.5-18.5 LF-MCG/0.5 IM SUSY: 0.5000 mL | PREFILLED_SYRINGE | Freq: Once | INTRAMUSCULAR | Status: DC

## 2020-02-18 MED ORDER — FENTANYL-BUPIVACAINE-NACL 0.5-0.125-0.9 MG/250ML-% EP SOLN
12.0000 mL/h | EPIDURAL | Status: DC | PRN
  Filled 2020-02-18: qty 250

## 2020-02-18 MED ORDER — ONDANSETRON HCL 4 MG PO TABS: 4.0000 mg | ORAL_TABLET | ORAL | Status: DC | PRN

## 2020-02-18 MED ORDER — LACTATED RINGERS IV SOLN: 500.0000 mL | INTRAVENOUS | Status: DC | PRN

## 2020-02-18 MED ORDER — OXYCODONE-ACETAMINOPHEN 5-325 MG PO TABS: 2.0000 | ORAL_TABLET | ORAL | Status: DC | PRN

## 2020-02-18 MED ORDER — COCONUT OIL OIL: 1.0000 "application " | TOPICAL_OIL | Status: DC | PRN

## 2020-02-18 MED ORDER — OXYCODONE-ACETAMINOPHEN 5-325 MG PO TABS: 1.0000 | ORAL_TABLET | ORAL | Status: DC | PRN

## 2020-02-18 MED ORDER — TRANEXAMIC ACID-NACL 1000-0.7 MG/100ML-% IV SOLN
INTRAVENOUS | Status: AC
  Administered 2020-02-18: 1000 mg
  Filled 2020-02-18: qty 100

## 2020-02-18 MED ORDER — ACETAMINOPHEN 325 MG PO TABS
650.0000 mg | ORAL_TABLET | Freq: Four times a day (QID) | ORAL | Status: DC
  Administered 2020-02-18 – 2020-02-19 (×3): 650 mg via ORAL
  Filled 2020-02-18 (×4): qty 2

## 2020-02-18 MED ORDER — SODIUM CHLORIDE (PF) 0.9 % IJ SOLN
INTRAMUSCULAR | Status: DC | PRN
  Administered 2020-02-18: 12 mL/h via EPIDURAL

## 2020-02-18 MED ORDER — PRENATAL MULTIVITAMIN CH
1.0000 | ORAL_TABLET | Freq: Every day | ORAL | Status: DC
  Administered 2020-02-19: 1 via ORAL
  Filled 2020-02-18: qty 1

## 2020-02-18 NOTE — Anesthesia Procedure Notes (Signed)
Epidural Patient location during procedure: OB Start time: 02/18/2020 2:56 PM End time: 02/18/2020 4:14 PM  Staffing Anesthesiologist: Bethena Midget, MD  Preanesthetic Checklist Completed: patient identified, IV checked, site marked, risks and benefits discussed, surgical consent, monitors and equipment checked, pre-op evaluation and timeout performed  Epidural Patient position: sitting Prep: DuraPrep and site prepped and draped Patient monitoring: continuous pulse ox and blood pressure Approach: midline Location: L3-L4 Injection technique: LOR air  Needle:  Needle type: Tuohy  Needle gauge: 17 G Needle length: 9 cm and 9 Needle insertion depth: 6 cm Catheter type: closed end flexible Catheter size: 19 Gauge Catheter at skin depth: 11 cm Test dose: negative  Assessment Events: blood not aspirated, injection not painful, no injection resistance, no paresthesia and negative IV test

## 2020-02-18 NOTE — Discharge Summary (Signed)
Postpartum Discharge Summary     Patient Name: Marie Wade DOB: 04-11-1990 MRN: [id number]  Date of admission: 02/18/2020 Delivery date:02/18/2020  Delivering provider: Randa Ngo  Date of discharge: 02/19/2020  Admitting diagnosis: IUP (intrauterine pregnancy), incidental [Z33.1] Intrauterine pregnancy: [redacted]w[redacted]d    Secondary diagnosis:  Active Problems:   Encounter for insertion of ParaGard IUD   History of deep vein thrombosis (DVT) during pregnancy   History of cesarean section, low transverse   Genetic carrier   Anemia during pregnancy in third trimester   Positive GBS test   IUP (intrauterine pregnancy), incidental   VBAC (vaginal birth after Cesarean)   Postpartum hemorrhage  Additional problems: as noted above Discharge diagnosis: VBAC                       Post partum procedures: post-placental Paraguard insertion, IV iron infusion Augmentation: Pitocin and IP Foley Complications: [id number] to atony  Hospital course: Induction of Labor With Vaginal Delivery   30y.o. yo [phone] admitted to the hospital 02/18/2020 for induction of labor.  Indication for induction: h/o DVT (declined lovenox in pregnancy).  Patient had an uncomplicated labor course as follows: Membrane Rupture Time/Date: 1:15 PM ,02/18/2020   Delivery Method:Vaginal, Spontaneous  Episiotomy: None  Lacerations:  None  Details of delivery can be found in separate delivery note. Postpartum period complicated by postpartum hemorrhage secondary to atony. Post-placental Paraguard IUD was placed given prompt resolution of bleeding s/p TXA, methergine and cytotec. Pt required IV iron infusion given drop in hemoglobin from 9.0 to 7.5 s/p delivery. Patient is discharged home 02/19/20. Given h/o DVT in prior pregnancy, pt was restarted on lovenox 4013maily 24hr s/p delivery with instructions to continue for 30 weeks.  Newborn Data: Birth date:02/18/2020  Birth time:4:39 PM   Gender:Female  Living status:Living  Apgars:7 ,9  Weight:3861 g   Magnesium Sulfate received: No BMZ received: No Rhophylac:N/A MMR:N/A T-DaP:Given prenatally Flu: offered prior to discharge Transfusion:Yes (IV iron)  Physical exam  Vitals:   02/18/20 2203 02/18/20 2235 02/19/20 0235 02/19/20 0545  BP: 124/69 121/83 110/82 127/73  Pulse: 82 80 83 83  Resp: '16  18 16  ' Temp: (!) 100.7 F (38.2 C) 98.2 F (36.8 C) 99.2 F (37.3 C) 99.2 F (37.3 C)  TempSrc: Oral Oral Oral Oral  SpO2: 99%  98% 100%  Weight:      Height:       General: alert, cooperative and no distress Lochia: appropriate Uterine Fundus: firm Incision: N/A DVT Evaluation: No evidence of DVT seen on physical exam. No cords or calf tenderness. No significant calf/ankle edema. Labs: Lab Results  Component Value Date   WBC 10.1 02/19/2020   HGB 7.5 (L) 02/19/2020   HCT 24.8 (L) 02/19/2020   MCV 77.0 (L) 02/19/2020   PLT 166 02/19/2020   CMP Latest Ref Rng & Units 10/05/2019  Glucose 65 - 99 mg/dL 66  BUN 6 - 20 mg/dL 7  Creatinine 0.57 - 1.00 mg/dL 0.58  Sodium 134 - 144 mmol/L 138  Potassium 3.5 - 5.2 mmol/L 3.9  Chloride 96 - 106 mmol/L 105  CO2 20 - 29 mmol/L 21  Calcium 8.7 - 10.2 mg/dL 9.0  Total Protein 6.0 - 8.5 g/dL 7.3  Total Bilirubin 0.0 - 1.2 mg/dL 0.4  Alkaline Phos 48 - 121 IU/L 64  AST 0 - 40 IU/L 20  ALT 0 - 32 IU/L 11   [place]  Score: Edinburgh Postnatal Depression Scale Screening Tool 02/18/2020  I have been able to laugh and see the funny side of things. (No Data)  I have looked forward with enjoyment to things. -  I have blamed myself unnecessarily when things went wrong. -  I have been anxious or worried for no good reason. -  I have felt scared or panicky for no good reason. -  Things have been getting on top of me. -  I have been so unhappy that I have had difficulty sleeping. -  I have felt sad or miserable. -  I have been so unhappy that I have been crying. -  The  thought of harming myself has occurred to me. Flavia Shipper Postnatal Depression Scale Total -     After visit meds:  Allergies as of 02/19/2020      Reactions   Watermelon [citrullus Vulgaris] Itching      Medication List    TAKE these medications   acetaminophen 325 MG tablet Commonly known as: Tylenol Take 2 tablets (650 mg total) by mouth every 6 (six) hours as needed.   coconut oil Oil Apply 1 application topically as needed (nipple pain).   enoxaparin 40 MG/0.4ML injection Commonly known as: LOVENOX Inject 0.4 mLs (40 mg total) into the skin daily. Start taking on: February 20, 2020   ibuprofen 600 MG tablet Commonly known as: ADVIL Take 1 tablet (600 mg total) by mouth every 8 (eight) hours as needed.   prenatal vitamin w/FE, FA 27-1 MG Tabs tablet Take 1 tablet by mouth daily at 12 noon.        Discharge home in stable condition Infant Feeding: Breast Infant Disposition:home with mother Discharge instruction: per After Visit Summary and Postpartum booklet. Activity: Advance as tolerated. Pelvic rest for 6 weeks.  Diet: routine diet Future Appointments:No future appointments. Follow up Visit: Message sent to [facility] to schedule postpartum appointment.  Please schedule this patient for a In person postpartum visit in 6 weeks with the following provider: any provider. Additional Postpartum F/U:f/u on lovenox (to continue 6 weeks postpartum)  High risk pregnancy complicated by: h/o DVT (declined lovenox in pregnancy), postpartum hemorrhage secondary to atony, h/o Cesarean x1 with successful VBAC Delivery mode:  Vaginal, Spontaneous  Anticipated Birth Control:  PP IUD placed  [name], Gildardo Cranker, MD OB Fellow, Faculty Practice 02/19/2020 10:28 AM

## 2020-02-18 NOTE — Anesthesia Postprocedure Evaluation (Signed)
Anesthesia Post Note  Patient: Marie Wade  Procedure(s) Performed: AN AD HOC LABOR EPIDURAL     Patient location during evaluation: Mother Baby Anesthesia Type: Epidural Level of consciousness: awake and alert Pain management: pain level controlled Vital Signs Assessment: post-procedure vital signs reviewed and stable Respiratory status: spontaneous breathing, nonlabored ventilation and respiratory function stable Cardiovascular status: stable Postop Assessment: no headache, no backache and epidural receding Anesthetic complications: no   No complications documented.  Last Vitals:  Vitals:   02/18/20 1930 02/18/20 2030  BP: 129/69 111/79  Pulse: 63 77  Resp: 14 18  Temp:  37.9 C  SpO2: 99% 100%    Last Pain:  Vitals:   02/18/20 2030  TempSrc: Oral  PainSc: (P) 0-No pain   Pain Goal:                   Marie Wade

## 2020-02-18 NOTE — Progress Notes (Signed)
Labor Progress Note Marie Wade is a 30 y.o. [phone] at [redacted]w[redacted]d presented for IOL secondary to h/o DVT (declined lovenox in pregnancy).  S: Pt reports increasing discomfort with contractions. Requests epidural at this time given inadequate pain coverage with IV fentanyl.  O:  BP 134/79   Pulse 67   Temp 98.4 F (36.9 C) (Oral)   Resp 16   Ht 5\' 8"  (1.727 m)   Wt 93.6 kg   LMP 05/21/2019   BMI 31.38 kg/m  EFM: baseline 120/moderate variability/+accels/intermittent variable decels Toco: ctx q2-4 min  CVE: Dilation: 7.5 Effacement (%): 90 Station: -1 Presentation: Vertex Exam by:: Dr. [email or web address]   A&P: 30 y.o. 37 [redacted]w[redacted]d presented for IOL secondary to h/o DVT (declined lovenox in pregnancy). #TOLAC: Progressing well s/p FB. Continued on pitocin. Now s/p SROM at 1315. Will plan to rupture forebag s/p epidural. #Pain: IV fentanyl. Plan for epidural now. #FWB: Category 2 strip secondary to intermittent variable decels; reassuringly multiple accelerations with moderate variability. #GBS positive; continued on PCN #H/o DVT: pt recommended to administer lovenox 40mg  daily in pregnancy; however, she declined. IOL as noted above. #[facility] Genetic Screen: peds aware as noted above #Anemia: Pt reports poor adherence to po iron in pregnancy. Hgb 9.0 on admission. Will plan for po vs IV iron s/p delivery.  [redacted]w[redacted]d, MD 2:47 PM

## 2020-02-18 NOTE — Anesthesia Preprocedure Evaluation (Signed)
Anesthesia Evaluation  Patient identified by MRN, date of birth, ID band Patient awake    Reviewed: Allergy & Precautions, H&P , NPO status , Patient's Chart, lab work & pertinent test results, reviewed documented beta blocker date and time   Airway Mallampati: II  TM Distance: >3 FB Neck ROM: Full    Dental no notable dental hx.    Pulmonary neg pulmonary ROS,    Pulmonary exam normal breath sounds clear to auscultation       Cardiovascular negative cardio ROS Normal cardiovascular exam Rhythm:Regular Rate:Normal     Neuro/Psych negative neurological ROS  negative psych ROS   GI/Hepatic negative GI ROS, Neg liver ROS,   Endo/Other  negative endocrine ROS  Renal/GU negative Renal ROS  negative genitourinary   Musculoskeletal   Abdominal   Peds  Hematology negative hematology ROS (+)   Anesthesia Other Findings   Reproductive/Obstetrics (+) Pregnancy                             Anesthesia Physical Anesthesia Plan  ASA: II  Anesthesia Plan: Epidural   Post-op Pain Management:    Induction:   PONV Risk Score and Plan:   Airway Management Planned:   Additional Equipment:   Intra-op Plan:   Post-operative Plan:   Informed Consent: I have reviewed the patients History and Physical, chart, labs and discussed the procedure including the risks, benefits and alternatives for the proposed anesthesia with the patient or authorized representative who has indicated his/her understanding and acceptance.       Plan Discussed with:   Anesthesia Plan Comments:         Anesthesia Quick Evaluation
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
## 2020-02-18 NOTE — Discharge Instructions (Signed)
Postpartum Care After Vaginal Delivery The following information offers guidance about how to care for yourself from the time you deliver your baby to 6-12 weeks after delivery (postpartum period). If you have problems or questions, contact your health care provider for more specific instructions. Follow these instructions at home: Vaginal bleeding  It is normal to have vaginal bleeding (lochia) after delivery. Wear a sanitary pad for bleeding and discharge. ? During the first week after delivery, the amount and appearance of lochia is often similar to a menstrual period. ? Over the next few weeks, it will gradually decrease to a dry, yellow-Heckel discharge. ? For most women, lochia stops completely by 4-6 weeks after delivery, but can vary.  Change your sanitary pads frequently. Watch for any changes in your flow, such as: ? A sudden increase in volume. ? A change in color. ? Large blood clots.  If you pass a blood clot from your vagina, save it and call your health care provider. Do not flush blood clots down the toilet before talking with your health care provider.  Do not use tampons or douches until your health care provider approves.  If you are not breastfeeding, your period should return 6-8 weeks after delivery. If you are feeding your baby breast milk only, your period may not return until you stop breastfeeding. Perineal care  Keep the area between the vagina and the anus (perineum) clean and dry. Use medicated pads and pain-relieving sprays and creams as directed.  If you had a surgical cut in the perineum (episiotomy) or a tear, check the area for signs of infection until you are healed. Check for: ? More redness, swelling, or pain. ? Fluid or blood coming from the cut or tear. ? Warmth. ? Pus or a bad smell.  You may be given a squirt bottle to use instead of wiping to clean the perineum area after you use the bathroom. Pat the area gently to dry it.  To relieve pain  caused by an episiotomy, a tear, or swollen veins in the anus (hemorrhoids), take a warm sitz bath 2-3 times a day. In a sitz bath, the warm water should only come up to your hips and cover your buttocks.   Breast care  In the first few days after delivery, your breasts may feel heavy, full, and uncomfortable (breast engorgement). Milk may also leak from your breasts. Ask your health care provider about ways to help relieve the discomfort.  If you are breastfeeding: ? Wear a bra that supports your breasts and fits well. Use breast pads to absorb milk that leaks. ? Keep your nipples clean and dry. Apply creams and ointments as told. ? You may have uterine contractions every time you breastfeed for up to several weeks after delivery. This helps your uterus return to its normal size. ? If you have any problems with breastfeeding, notify your health care provider or lactation consultant.  If you are not breastfeeding: ? Avoid touching your breasts. Do not squeeze out (express) milk. Doing this can make your breasts produce more milk. ? Wear a good-fitting bra and use cold packs to help with swelling. Intimacy and sexuality  Ask your health care provider when you can engage in sexual activity. This may depend upon: ? Your risk of infection. ? How fast you are healing. ? Your comfort and desire to engage in sexual activity.  You are able to get pregnant after delivery, even if you have not had your period. Talk with   your health care provider about methods of birth control (contraception) or family planning if you desire future pregnancies. Medicines  Take over-the-counter and prescription medicines only as told by your health care provider.  Take an over-the-counter stool softener to help ease bowel movements as told by your health care provider.  If you were prescribed an antibiotic medicine, take it as told by your health care provider. Do not stop taking the antibiotic even if you start to  feel better.  Review all previous and current prescriptions to check for possible transfer into breast milk. Activity  Gradually return to your normal activities as told by your health care provider.  Rest as much as possible. Nap while your baby is sleeping. Eating and drinking  Drink enough fluid to keep your urine pale yellow.  To help prevent or relieve constipation, eat high-fiber foods every day.  Choose healthy eating to support breastfeeding or weight loss goals.  Take your prenatal vitamins until your health care provider tells you to stop.   General tips/recommendations  Do not use any products that contain nicotine or tobacco. These products include cigarettes, chewing tobacco, and vaping devices, such as e-cigarettes. If you need help quitting, ask your health care provider.  Do not drink alcohol, especially if you are breastfeeding.  Do not take medications or drugs that are not prescribed to you, especially if you are breastfeeding.  Visit your health care provider for a postpartum checkup within the first 3-6 weeks after delivery.  Complete a comprehensive postpartum visit no later than 12 weeks after delivery.  Keep all follow-up visits for you and your baby. Contact a health care provider if:  You feel unusually sad or worried.  Your breasts become red, painful, or hard.  You have a fever or other signs of an infection.  You have bleeding that is soaking through one pad an hour or you have blood clots.  You have a severe headache that doesn't go away or you have vision changes.  You have nausea and vomiting and are unable to eat or drink anything for 24 hours. Get help right away if:  You have chest pain or difficulty breathing.  You have sudden, severe leg pain.  You faint or have a seizure.  You have thoughts about hurting yourself or your baby. If you ever feel like you may hurt yourself or others, or have thoughts about taking your own life,  get help right away. Go to your nearest emergency department or:  Call your local emergency services (911 in the [place]  The National Suicide Prevention Lifeline at 1-800-273-8255. This suicide crisis helpline is open 24 hours a day.  Text the Crisis Text Line at 741741 (in the [place] Summary  The period of time after you deliver your newborn up to 6-12 weeks after delivery is called the postpartum period.  Keep all follow-up visits for you and your baby.  Review all previous and current prescriptions to check for possible transfer into breast milk.  Contact a health care provider if you feel unusually sad or worried during the postpartum period. This information is not intended to replace advice given to you by your health care provider. Make sure you discuss any questions you have with your health care provider. Document Revised: 10/05/2019 Document Reviewed: 10/05/2019 Elsevier Patient Education  2021 Elsevier Inc.   Intrauterine Device Insertion, Care After This sheet gives you information about how to care for yourself after your procedure. Your health care provider may also   give you more specific instructions. If you have problems or questions, contact your health care provider. What can I expect after the procedure? After the procedure, it is common to have:  Cramps and pain in the abdomen.  Bleeding. It may be light or heavy. This may last for a few days.  Lower back pain.  Dizziness.  Headaches.  Nausea. Follow these instructions at home:  Before resuming sexual activity, check to make sure that you can feel the IUD string or strings. You should be able to feel the end of the string below the opening of your cervix. If your IUD string is in place, you may resume sexual activity. ? If you had a hormonal IUD inserted more than 7 days after your most recent period started, you will need to use a backup method of birth control for 7 days after IUD insertion. Ask your  health care provider whether this applies to you.  Continue to check that the IUD is still in place by feeling for the strings after every menstrual period, or once a month.  An IUD will not protect you from sexually transmitted infections (STIs). Use methods to prevent the exchange of body fluids between partners (barrier protection) every time you have sex. Barrier protection can be used during oral, vaginal, or anal sex. Commonly used barrier methods include: ? Female condom. ? Female condom. ? Dental dam.  Take over-the-counter and prescription medicines only as told by your health care provider.  Keep all follow-up visits as told by your health care provider. This is important.   Contact a health care provider if:  You feel light-headed or weak.  You have any of the following problems with your IUD string or strings: ? The string bothers or hurts you or your sexual partner. ? You cannot feel the string. ? The string has gotten longer.  You can feel the IUD in your vagina.  You think you may be pregnant, or you miss your menstrual period.  You think you may have a sexually transmitted infection (STI). Get help right away if:  You have flu-like symptoms, such as tiredness (fatigue) and muscle aches.  You have a fever and chills.  You have bleeding that is heavier or lasts longer than a normal menstrual cycle.  You have abnormal or bad-smelling discharge from your vagina.  You develop abdominal pain that is new, is getting worse, or is not in the same area of earlier cramping and pain.  You have pain during sexual activity. Summary  After the procedure, it is common to have cramps and pain in the abdomen. It is also common to have light bleeding or heavier bleeding that is like your menstrual period.  Continue to check that the IUD is still in place by feeling for the strings after every menstrual period, or once a month.  Keep all follow-up visits as told by your health  care provider. This is important.  Contact your health care provider if you have problems with your IUD strings, such as the string getting longer or bothering you or your sexual partner. This information is not intended to replace advice given to you by your health care provider. Make sure you discuss any questions you have with your health care provider. Document Revised: 01/10/2019 Document Reviewed: 01/10/2019 Elsevier Patient Education  2021 Elsevier Inc.  

## 2020-02-19 LAB — CBC
HCT: 24.8 % — ABNORMAL LOW (ref 36.0–46.0)
Hemoglobin: 7.5 g/dL — ABNORMAL LOW (ref 12.0–15.0)
MCH: 23.3 pg — ABNORMAL LOW (ref 26.0–34.0)
MCHC: 30.2 g/dL (ref 30.0–36.0)
MCV: 77 fL — ABNORMAL LOW (ref 80.0–100.0)
Platelets: 166 10*3/uL (ref 150–400)
RBC: 3.22 MIL/uL — ABNORMAL LOW (ref 3.87–5.11)
RDW: 15.9 % — ABNORMAL HIGH (ref 11.5–15.5)
WBC: 10.1 10*3/uL (ref 4.0–10.5)
nRBC: 0 % (ref 0.0–0.2)

## 2020-02-19 MED ORDER — ENOXAPARIN SODIUM 40 MG/0.4ML ~~LOC~~ SOLN: 40.0000 mg | SUBCUTANEOUS | 0 refills | Status: DC

## 2020-02-19 MED ORDER — IBUPROFEN 600 MG PO TABS: 600.0000 mg | ORAL_TABLET | Freq: Three times a day (TID) | ORAL | 0 refills | Status: DC | PRN

## 2020-02-19 MED ORDER — ENOXAPARIN SODIUM 40 MG/0.4ML ~~LOC~~ SOLN
40.0000 mg | SUBCUTANEOUS | Status: DC
  Filled 2020-02-19: qty 0.4

## 2020-02-19 MED ORDER — ACETAMINOPHEN 325 MG PO TABS: 650.0000 mg | ORAL_TABLET | Freq: Four times a day (QID) | ORAL | Status: DC | PRN

## 2020-02-19 MED ORDER — COCONUT OIL OIL: 1.0000 "application " | TOPICAL_OIL | 0 refills | Status: DC | PRN

## 2020-02-19 NOTE — Progress Notes (Signed)
Post Partum Day 1 Subjective: no complaints, up ad lib, voiding, tolerating PO and + flatus  Objective: Blood pressure 127/73, pulse 83, temperature 99.2 F (37.3 C), temperature source Oral, resp. rate 16, height 5\' 8"  (1.727 m), weight 93.6 kg, last menstrual period 05/21/2019, SpO2 100 %, currently breastfeeding.  Physical Exam:  General: alert, cooperative and no distress Lochia: appropriate Uterine Fundus: firm Incision: n/a DVT Evaluation: No evidence of DVT seen on physical exam.  Recent Labs    02/18/20 1739 02/19/20 0411  HGB 8.0* 7.5*  HCT 27.0* 24.8*    Assessment/Plan: Patient doing well without complaint. She is ambulatory with no dizziness or lightheadedness. She received IV iron yesterday. She desires discharge today. Discussed with patient that she must be 24 hrs PP at a minimum to go home and infant must be cleared by pediatrician. Will reassess around 1600 and possibly discharge at that time.    LOS: 1 day   02/21/20 CNM 02/19/2020, 6:13 AM

## 2020-02-20 ENCOUNTER — Telehealth: Payer: Self-pay

## 2020-02-20 NOTE — Telephone Encounter (Signed)
Transition Care Management Unsuccessful Follow-up Telephone Call  Date of discharge and from where:  02/19/2020 [facility]  Attempts:  1st Attempt  Reason for unsuccessful TCM follow-up call:  Left voice message, call went straight to voicemail.

## 2020-02-21 ENCOUNTER — Encounter: Payer: Self-pay | Admitting: Emergency Medicine

## 2020-02-21 ENCOUNTER — Ambulatory Visit
Admission: EM | Admit: 2020-02-21 | Discharge: 2020-02-21 | Disposition: A | Discharge status: Home / Self Care | Payer: Medicaid Other | Attending: Emergency Medicine | Admitting: Emergency Medicine

## 2020-02-21 ENCOUNTER — Other Ambulatory Visit: Payer: Self-pay

## 2020-02-21 DIAGNOSIS — M549 Dorsalgia, unspecified: Secondary | ICD-10-CM | POA: Diagnosis not present

## 2020-02-21 LAB — POCT URINALYSIS DIP (MANUAL ENTRY)
Bilirubin, UA: NEGATIVE
Glucose, UA: NEGATIVE mg/dL
Ketones, POC UA: NEGATIVE mg/dL
Nitrite, UA: NEGATIVE
Protein Ur, POC: NEGATIVE mg/dL
Spec Grav, UA: 1.025 (ref 1.010–1.025)
Urobilinogen, UA: 4 E.U./dL — AB
pH, UA: 7 (ref 5.0–8.0)
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
## 2020-02-21 NOTE — ED Triage Notes (Signed)
Gave birth on Sunday now has pain on left side of back radiating to front side.

## 2020-02-21 NOTE — Telephone Encounter (Signed)
Transition Care Management Unsuccessful Follow-up Telephone Call  Date of discharge and from where:  02/19/2020 [facility]   Attempts:  2nd Attempt  Reason for unsuccessful TCM follow-up call:  Left voice message

## 2020-02-21 NOTE — Discharge Instructions (Addendum)
Urine culture sent.  We will call you with the results.   Push fluids and get plenty of rest.   Take OTC Tylenol as needed for pain If symptom does not resolve [facility]  Return here or go to ER if you have any new or worsening symptoms such as fever, worsening abdominal pain, nausea/vomiting, flank pain, etc..Marland Kitchen

## 2020-02-22 NOTE — Telephone Encounter (Signed)
Transition Care Management Unsuccessful Follow-up Telephone Call  /Date of discharge and [facility] where[facility]  [facility] Attempts:  3rd Attempt  Reason for unsuccessful TCM follow-up call:  Unable to reach patient

## 2020-02-23 LAB — URINE CULTURE: Culture: 6000 — AB

## 2020-03-07 ENCOUNTER — Telehealth: Payer: Self-pay | Admitting: Family Medicine

## 2020-03-07 NOTE — Telephone Encounter (Signed)
Pt states that she startedsevere bleeding with clots that started 2 days ago, same as when she first gave birth, front left side abd pain, headaches. Pt request  Call ASAP.

## 2020-03-11 ENCOUNTER — Other Ambulatory Visit: Payer: Self-pay

## 2020-03-11 ENCOUNTER — Ambulatory Visit (INDEPENDENT_AMBULATORY_CARE_PROVIDER_SITE_OTHER): Payer: Medicaid Other | Admitting: Obstetrics and Gynecology

## 2020-03-11 ENCOUNTER — Encounter: Payer: Self-pay | Admitting: Obstetrics and Gynecology

## 2020-03-11 DIAGNOSIS — N750 Cyst of Bartholin's gland: Secondary | ICD-10-CM

## 2020-03-11 DIAGNOSIS — R102 Pelvic and perineal pain unspecified side: Secondary | ICD-10-CM | POA: Insufficient documentation

## 2020-03-11 DIAGNOSIS — Z975 Presence of (intrauterine) contraceptive device: Secondary | ICD-10-CM | POA: Insufficient documentation

## 2020-03-11 HISTORY — DX: Presence of (intrauterine) contraceptive device: Z97.5

## 2020-03-11 MED ORDER — SULFAMETHOXAZOLE-TRIMETHOPRIM 800-160 MG PO TABS
1.0000 | ORAL_TABLET | Freq: Two times a day (BID) | ORAL | 0 refills | Status: AC
Start: 2020-03-11 — End: 2020-03-16

## 2020-03-11 NOTE — Progress Notes (Signed)
Pt wants IUD removed.

## 2020-03-11 NOTE — Patient Instructions (Signed)
Bartholin's Cyst  A Bartholin's cyst is a fluid-filled sac that forms on a Bartholin's gland. Bartholin's glands are small glands in the folds of skin near the opening of the vagina (labia). This type of cyst causes a bulge or lump near the opening of the vagina. If you have a cyst that is small and not infected, you may be able to take care of it at home. If your cyst gets infected, it may cause pain and your doctor may need to drain it. What are the causes? This condition may be caused by a blocked Bartholin's gland. Germs (bacteria) inside of the cyst can cause an infection. What are the signs or symptoms?  A bulge or lump near the opening of the vagina.  Discomfort or pain.  Redness, swelling, or fluid draining from the area. How is this treated? You may not need treatment if your cyst is not causing symptoms. The cyst can go away on its own with home care. Home care includes hot baths or heat therapy. Large cysts or cysts that are infected may be treated with:  Antibiotic medicine.  A procedure to drain the fluid. Cysts that keep coming back will need to be drained many times. Your doctor may talk to you about surgery to remove the cyst. Follow these instructions at home: Medicines  Take over-the-counter and prescription medicines only as told by your doctor.  If you were prescribed an antibiotic medicine, take it as told by your doctor. Do not stop taking it even if you start to feel better. Managing pain and swelling  Try sitz baths to help with pain and swelling. A sitz bath is a warm water bath in which the water only comes up to your hips and should cover your buttocks. You may take sitz baths a few times a day.  If told, put heat on the affected area as often as needed. Use the heat source that your doctor recommends, such as a moist heat pack or a heating pad. ? Place a towel between your skin and the heat source. ? Leave the heat on for 20-30 minutes. ? Take off the  heat if your skin turns bright red. This is very important. If you cannot feel pain, heat, or cold, you have a greater risk of getting burned. General instructions  If your cyst was drained: ? Follow instructions from your doctor about how to take care of your wound. ? Use feminine pads to absorb any fluid.  Do not push on or squeeze your cyst.  Do not have sex until the cyst has gone away or your wound from drainage has healed.  Take these steps to help prevent a cyst from returning, and to prevent other cysts from forming: ? Take a bath or shower once a day. Clean the area around your vagina with mild soap and water when you bathe. ? Practice safe sex to prevent STIs. Talk with your doctor about how to prevent STIs and which forms of birth control to use.  Keep all follow-up visits. Contact a doctor if:  You have a fever.  You get more redness, swelling, or pain around your cyst.  You have fluid, blood, pus, or a bad smell coming from your cyst.  You have a cyst that gets larger or a cyst that comes back. Summary  A Bartholin's cyst is a fluid-filled sac that forms on a Bartholin's gland. These small glands are found in the folds of skin near the opening of the   vagina (labia).  This type of cyst causes a bulge or lump near the opening of the vagina.  Try sitz baths a few times a day to help with pain and swelling.  Do not push on or squeeze your cyst. This information is not intended to replace advice given to you by your health care provider. Make sure you discuss any questions you have with your health care provider. Document Revised: 06/19/2019 Document Reviewed: 06/19/2019 Elsevier Patient Education  2021 Elsevier Inc.  

## 2020-03-11 NOTE — Progress Notes (Signed)
[place] scheduled for 03/15/20 @ 11:30a

## 2020-03-11 NOTE — Progress Notes (Signed)
   Subjective:    Patient ID: Marie Wade, female    DOB: 26-Aug-1990, 30 y.o.   MRN: [id number]  HPI Pt seen for evaluation of pelvic pain with presence of new paraguard IUD which was placed post-placentally after delivery on 02/18/20.  She had been fine until last week when she developed a mild/moderate LLQ pain.  Pt is also concerned about a new Bartholin's gland cyst.  She has had multiple recurrences on the left side, but the new lesion is the first time she has had it on the right.  Review of Systems     Objective:   Physical Exam Vitals:   03/11/20 1125  BP: 123/75  Pulse: 87   SVE: IUD strings easily seen, small amount of light bleeding. Vulva: developing right bartholin's cyst., mildly tender to palpation, nonfluctuant        Assessment & Plan:   1. Presence of intrauterine contraceptive device (IUD) Being that the IUD was placed just after delivery, it is unlikely but not impossible that the device is the cause of the pain.  Pt was asked to get pelvic u/s to eval the placement of device before removal.  Pt agrees.  If pain continues/worsens or pt desires removal, the device can be removed at the next visit - US PELVIC COMPLETE WITH TRANSVAGINAL; Future  2. Pelvic pain in female  - US PELVIC COMPLETE WITH TRANSVAGINAL; Future  3. Bartholin's gland cyst Will treat conservatively with antibiotics at this time.  Will reassess in 2 weeks with possible I and D with ward catheter placement - sulfamethoxazole-trimethoprim (BACTRIM DS) 800-160 MG tablet; Take 1 tablet by mouth 2 (two) times daily for 5 days.  Dispense: 10 tablet; Refill: 0  F/u in 2 weeks to review u/se-evaluate bartholins cyst  Warden Fillers, MD Faculty Attending, Center for [facility]

## 2020-03-12 NOTE — Telephone Encounter (Signed)
Pt concern addressed in pp visit scheduled on 03/11/20.  Marie Wade  03/12/20

## 2020-03-15 ENCOUNTER — Ambulatory Visit (HOSPITAL_COMMUNITY)
Admission: RE | Admit: 2020-03-15 | Discharge: 2020-03-15 | Disposition: A | Discharge status: Home / Self Care | Payer: Medicaid Other | Source: Ambulatory Visit | Attending: Obstetrics and Gynecology | Admitting: Obstetrics and Gynecology

## 2020-03-15 ENCOUNTER — Other Ambulatory Visit: Payer: Self-pay

## 2020-03-15 DIAGNOSIS — Z975 Presence of (intrauterine) contraceptive device: Secondary | ICD-10-CM | POA: Insufficient documentation

## 2020-03-15 DIAGNOSIS — R102 Pelvic and perineal pain: Secondary | ICD-10-CM | POA: Diagnosis not present

## 2020-03-25 DIAGNOSIS — Z1159 Encounter for screening for other viral diseases: Secondary | ICD-10-CM | POA: Diagnosis not present

## 2020-04-02 ENCOUNTER — Ambulatory Visit: Payer: Medicaid Other | Admitting: Obstetrics and Gynecology

## 2020-04-04 ENCOUNTER — Encounter: Payer: Self-pay | Admitting: Obstetrics and Gynecology

## 2020-04-04 ENCOUNTER — Other Ambulatory Visit: Payer: Self-pay

## 2020-04-04 ENCOUNTER — Ambulatory Visit (INDEPENDENT_AMBULATORY_CARE_PROVIDER_SITE_OTHER): Payer: Medicaid Other | Admitting: Obstetrics and Gynecology

## 2020-04-04 ENCOUNTER — Telehealth: Payer: Self-pay | Admitting: *Deleted

## 2020-04-04 DIAGNOSIS — R222 Localized swelling, mass and lump, trunk: Secondary | ICD-10-CM | POA: Diagnosis not present

## 2020-04-04 NOTE — Progress Notes (Signed)
    Post Partum Visit Note  Marie Wade is a 30 y.o. [phone] female who presents for a postpartum visit. She is 6 weeks postpartum following a normal spontaneous vaginal delivery.  I have fully reviewed the prenatal and intrapartum course. The delivery was at 39 gestational weeks.  Anesthesia: epidural. Postpartum course has been normal. Baby is doing well. Baby is feeding by breast. Bleeding [place]. Bowel function is normal. Bladder function is normal. Patient is sexually active. Contraception method is IUD. Postpartum depression screening: negative.   The pregnancy intention screening data noted above was reviewed. Potential methods of contraception were discussed. The patient elected to proceed with IUD or IUS.   Pt noted her "Bartholin's area" has improved.  She notes she never took the antibiotics which were prescribed.  She denies vulvar pain.  Pt does not a soft tissue mass in her left lower back which is becoming more tender.   The following portions of the patient's history were reviewed and updated as appropriate: allergies, current medications, past family history, past medical history, past social history, past surgical history and problem list.  Review of Systems Pertinent items are noted in HPI.    Objective:  LMP 05/21/2019    General:  alert, cooperative and no distress   Breasts:  not performed  Lungs: clear to auscultation bilaterally  Heart:  regular rate and rhythm  Abdomen: soft, non-tender; bowel sounds normal; no masses,  no organomegaly   Vulva:  not evaluated  Vagina: not evaluated  Cervix:  not evaluated  Corpus: not examined  Adnexa:  not evaluated  Rectal Exam: Not performed.        Assessment:    normal  postpartum exam. Pap smear not done at today's visit.   Plan:   Essential components of care per ACOG recommendations:  1.  Mood and well being: Patient with negative depression screening today. Reviewed local resources for support.  - Patient  does not use tobacco. - hx of drug use? No  *  2. Infant care and feeding:  -Patient currently breastmilk feeding? Yes   Pt notes she may wean early as she is not delivering a lot of milk.  -Social determinants of health (SDOH) reviewed in EPIC. No concerns  3. Sexuality, contraception and birth spacing - Patient does not want a pregnancy in the next year.  Desired family size is 4 children.  - Reviewed forms of contraception in tiered fashion. Patient desired IUD today.   - Discussed birth spacing of 18 months  4. Sleep and fatigue -Encouraged family/partner/community support of 4 hrs of uninterrupted sleep to help with mood and fatigue  5. Physical Recovery  - Discussed patients delivery and complications - Patient had a no lacerations. Perineal healing reviewed. Patient expressed understanding - Patient has urinary incontinence? No - Patient is safe to resume physical and sexual activity  6.  Health Maintenance - Last pap smear done 1/10/202 and was normal with negative HPV.   7. Back mass:  Will order lumbar MRI, is mass noted will refer to general surgery for further eval and treatment.  [name], MD Center for [employer], [facility]

## 2020-04-04 NOTE — Telephone Encounter (Signed)
Scheduled MRI as ordered for 04/13/20 1:00( arrive 12:30 to ER to check in ).  I called [facility] and left message with appointment information and to call or message if questions. I also will send Mychart message.  Ionia Schey,RN

## 2020-04-13 ENCOUNTER — Ambulatory Visit (HOSPITAL_COMMUNITY): Payer: Medicaid Other

## 2020-04-15 ENCOUNTER — Encounter: Payer: Medicaid Other | Admitting: Nurse Practitioner

## 2020-04-21 ENCOUNTER — Ambulatory Visit (HOSPITAL_COMMUNITY)
Admission: RE | Admit: 2020-04-21 | Discharge: 2020-04-21 | Disposition: A | Discharge status: Home / Self Care | Payer: Medicaid Other | Source: Ambulatory Visit | Attending: Obstetrics and Gynecology | Admitting: Obstetrics and Gynecology

## 2020-04-21 DIAGNOSIS — M545 Low back pain, unspecified: Secondary | ICD-10-CM | POA: Diagnosis not present

## 2020-04-21 DIAGNOSIS — R222 Localized swelling, mass and lump, trunk: Secondary | ICD-10-CM | POA: Diagnosis not present

## 2020-04-21 DIAGNOSIS — D1809 Hemangioma of other sites: Secondary | ICD-10-CM | POA: Diagnosis not present

## 2020-04-21 DIAGNOSIS — R1909 Other intra-abdominal and pelvic swelling, mass and lump: Secondary | ICD-10-CM | POA: Diagnosis not present

## 2020-04-21 MED ORDER — GADOBUTROL 1 MMOL/ML IV SOLN
8.5000 mL | Freq: Once | INTRAVENOUS | Status: AC | PRN
  Administered 2020-04-21: 8.5 mL via INTRAVENOUS

## 2020-06-17 ENCOUNTER — Ambulatory Visit: Payer: Medicaid Other | Admitting: Internal Medicine

## 2020-06-17 ENCOUNTER — Encounter: Payer: Self-pay | Admitting: Internal Medicine

## 2020-06-17 ENCOUNTER — Other Ambulatory Visit: Payer: Self-pay

## 2020-06-17 VITALS — BP 106/60 | HR 70 | Temp 98.3°F | Resp 18 | Ht 68.0 in | Wt 175.1 lb

## 2020-06-17 DIAGNOSIS — R4589 Other symptoms and signs involving emotional state: Secondary | ICD-10-CM | POA: Diagnosis not present

## 2020-06-17 DIAGNOSIS — O99893 Other specified diseases and conditions complicating puerperium: Secondary | ICD-10-CM | POA: Diagnosis not present

## 2020-06-17 DIAGNOSIS — R5383 Other fatigue: Secondary | ICD-10-CM

## 2020-06-17 DIAGNOSIS — D649 Anemia, unspecified: Secondary | ICD-10-CM

## 2020-06-17 NOTE — Patient Instructions (Addendum)
Please get fasting blood tests done within a week.  You are being referred to Behavioral health therapist. Weakness Weakness is a lack of strength. You may feel weak all over your body (generalized), or you may feel weak in one part of your body (focal). There are many potential causes of weakness. Sometimes, the cause of your weakness may not be known. Some causes of weakness can be serious, so it is important to see your doctor. Follow these instructions at home: Activity  Rest as needed.  Try to get enough sleep. Most adults need 7-8 hours of sleep each night. Talk to your doctor about how much sleep you need each night.  Do exercises, such as arm curls and leg raises, for 30 minutes at least 2 days a week or as told by your doctor.  Think about working with a physical therapist or trainer to help you get stronger. General instructions  Take over-the-counter and prescription medicines only as told by your doctor.  Eat a healthy, well-balanced diet. This includes: ? Proteins to build muscles, such as lean meats and fish. ? Fresh fruits and vegetables. ? Carbohydrates to boost energy, such as whole grains.  Drink enough fluid to keep your pee (urine) pale yellow.  Keep all follow-up visits as told by your doctor. This is important.   Contact a doctor if:  Your weakness does not get better or it gets worse.  Your weakness affects your ability to: ? Think clearly. ? Do your normal daily activities. Get help right away if you:  Have sudden weakness on one side of your face or body.  Have chest pain.  Have trouble breathing or shortness of breath.  Have problems with your vision.  Have trouble talking or swallowing.  Have trouble standing or walking.  Are light-headed.  Pass out (lose consciousness). Summary  Weakness is a lack of strength. You may feel weak all over your body or just in one part of your body.  There are many potential causes of weakness.  Sometimes, the cause of your weakness may not be known.  Rest as needed, and try to get enough sleep. Most adults need 7-8 hours of sleep each night.  Eat a healthy, well-balanced diet. This information is not intended to replace advice given to you by your health care provider. Make sure you discuss any questions you have with your health care provider. Document Revised: 08/25/2017 Document Reviewed: 08/25/2017 Elsevier Patient Education  2021 [employer].

## 2020-06-18 ENCOUNTER — Telehealth: Payer: Medicaid Other

## 2020-06-18 ENCOUNTER — Telehealth (INDEPENDENT_AMBULATORY_CARE_PROVIDER_SITE_OTHER): Payer: Medicaid Other | Admitting: Licensed Clinical Social Worker

## 2020-06-18 DIAGNOSIS — R4589 Other symptoms and signs involving emotional state: Secondary | ICD-10-CM

## 2020-06-18 DIAGNOSIS — O99893 Other symptoms and signs involving emotional state: Secondary | ICD-10-CM

## 2020-06-18 NOTE — BH Specialist Note (Signed)
[facility] Initial Clinical Assessment  MRN: [id number] NAME: Marie Wade Date: 06/18/20  Start time:  10a End time:  1030a Total time:  30 min Call number:  video visit  Type of Contact:  video Patient consent obtained:  yes Reason for Visit today:  initiate VBH service  Treatment History Patient recently received Inpatient Treatment:  no  Facility/Program:    Date of discharge:   Patient currently being seen by therapist/psychiatrist:   Patient currently receiving the following services:    Past Psychiatric History/Hospitalization(s): Anxiety: No Bipolar Disorder: No Depression: No Mania: No Psychosis: No Schizophrenia: No Personality Disorder: No Hospitalization for psychiatric illness: No History of Electroconvulsive Shock Therapy: No Prior Suicide Attempts: No  Clinical Assessment:  PHQ-9 Assessments: Depression screen [facility] 2/9 06/17/2020 04/04/2020 02/14/2020  Decreased Interest 0 0 0  Down, Depressed, Hopeless 1 1 0  PHQ - 2 Score 1 1 0  Altered sleeping 0 1 2  Tired, decreased energy 0 1 2  Change in appetite 1 0 0  Feeling bad or failure about yourself  1 0 0  Trouble concentrating 0 0 0  Moving slowly or fidgety/restless 0 0 0  Suicidal thoughts 1 0 0  PHQ-9 Score 4 3 4   Difficult doing work/chores Somewhat difficult - -  Some recent data might be hidden    GAD-7 Assessments: GAD 7 : Generalized Anxiety Score 06/17/2020 04/04/2020 02/14/2020 02/14/2020  Nervous, Anxious, on Edge 0 0 0 0  Control/stop worrying 0 0 0 0  Worry too much - different things 0 0 0 0  Trouble relaxing 0 0 2 2  Restless 0 0 0 0  Easily annoyed or irritable 0 0 2 2  Afraid - awful might happen 0 0 0 0  Total GAD 7 Score 0 0 4 4  Anxiety Difficulty Not difficult at all - - -     Social Functioning Social maturity:  WNL Social judgement:  WNL  Stress Current stressors:  having  4 children, decided not to go back to work, poor support Familial stressors:  poor  support from partner, 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 acetaminophen (TYLENOL) 325 MG tablet Take 2 tablets (650 mg total) by mouth every 6 (six) hours as needed.  . coconut oil OIL Apply 1 application topically as needed (nipple pain).  06/20/2020 ibuprofen (ADVIL) 600 MG tablet Take 1 tablet (600 mg total) by mouth every 8 (eight) hours as needed.   No facility-administered encounter medications on file as of 06/18/2020.    Self-harm Behaviors Risk Assessment Self-harm risk factors:  n/a Patient endorses recent thoughts of harming self:    06/20/2020 Suicide Severity Rating Scale:  C-SRSS 2020-02-22  1. Wish to be Dead No  2. Suicidal Thoughts No  6. Suicide Behavior Question No    Danger to Others Risk Assessment Danger to others risk factors:  n/a Patient endorses recent thoughts of harming others:    Dynamic Appraisal of Situational Aggression (DASA): No flowsheet data found.  Substance Use Assessment Patient recently consumed alcohol:    Alcohol Use Disorder Identification Test (AUDIT): No flowsheet data found. Patient recently used drugs:    Opioid Risk Assessment:  Patient is concerned about dependence or abuse of substances:    ASAM Multidimensional Assessment Summary:  Dimension 1:  Dimension 1 Rating:    Dimension 2:    Dimension 2 Rating:    Dimension 3:    Dimension 3 Rating:    Dimension 4:    Dimension 4 Rating:    Dimension 5:    Dimension 5 Rating:    Dimension 6:    Dimension 6 Rating:   ASAM's Severity Rating Score:   ASAM Recommended Level of Treatment:     Goals, Interventions and Follow-up Plan Goals: Increase healthy adjustment to current life circumstances Interventions: Solution-Focused Strategies Follow-up Plan: Patient declines VBH services  Summary of  Clinical Assessment Summary: Marie Wade is a 30 year old woman that was referred by her PCP.  Marie Wade recently had a baby, 4 months ago. 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 She has poor sleep patterns as well as eating habits.  She has an inconsistent schedule with her children. She discussed that she has minimal support from her partner. Denies danger to self or others.  Writer discussed with her the importance of having a schedule and to ensure that she is able to have a balance for herself and her children.  Encouraged her to encourage her partner to assist in small increments until he is comfortable.    Marinda Elk, LCSW

## 2020-06-19 ENCOUNTER — Telehealth: Payer: Self-pay | Admitting: Licensed Clinical Social Worker

## 2020-06-19 DIAGNOSIS — R4589 Other symptoms and signs involving emotional state: Secondary | ICD-10-CM

## 2020-06-19 DIAGNOSIS — O99893 Other specified diseases and conditions complicating puerperium: Secondary | ICD-10-CM

## 2020-06-19 NOTE — Progress Notes (Signed)
Virtual behavioral Health Initiative (vBHI) Psychiatric Consultant Case Review   Marie Wade is a 30 y.o. year old female with a history of no known psychiatry disorder. She has 4 children including 4 months old. She has limited support from her partner at home. She declined VBH service.   Assessment/Provisional Diagnosis # adjustment disorder She declined vBHI service.  Recommendation  - She will be signed off due to patient declining the service.   Thank you for your consult. Please contact vBHI  for any questions or concerns.   The above treatment considerations and suggestions are based on consultation with the [facility] specialist and/or PCP and a review of information available in the shared registry and the patient's Electronic Health Record (EHR). I have not personally examined the patient. All recommendations should be implemented with consideration of the patient's relevant prior history and current clinical status. Please feel free to call me with any questions about the care of this patient.
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
## 2020-06-20 DIAGNOSIS — R5383 Other fatigue: Secondary | ICD-10-CM | POA: Diagnosis not present

## 2020-06-20 DIAGNOSIS — D649 Anemia, unspecified: Secondary | ICD-10-CM | POA: Diagnosis not present

## 2020-06-21 LAB — CBC WITH DIFFERENTIAL/PLATELET
Basophils Absolute: 0 10*3/uL (ref 0.0–0.2)
Basos: 1 %
EOS (ABSOLUTE): 0.1 10*3/uL (ref 0.0–0.4)
Eos: 2 %
Hematocrit: 36.5 % (ref 34.0–46.6)
Hemoglobin: 11.6 g/dL (ref 11.1–15.9)
Immature Grans (Abs): 0 10*3/uL (ref 0.0–0.1)
Immature Granulocytes: 0 %
Lymphocytes Absolute: 1.7 10*3/uL (ref 0.7–3.1)
Lymphs: 32 %
MCH: 25.2 pg — ABNORMAL LOW (ref 26.6–33.0)
MCHC: 31.8 g/dL (ref 31.5–35.7)
MCV: 79 fL (ref 79–97)
Monocytes Absolute: 0.5 10*3/uL (ref 0.1–0.9)
Monocytes: 9 %
Neutrophils Absolute: 3.1 10*3/uL (ref 1.4–7.0)
Neutrophils: 56 %
Platelets: 268 10*3/uL (ref 150–450)
RBC: 4.6 x10E6/uL (ref 3.77–5.28)
RDW: 14.2 % (ref 11.7–15.4)
WBC: 5.4 10*3/uL (ref 3.4–10.8)

## 2020-06-21 LAB — TSH+FREE T4
Free T4: 0.96 ng/dL (ref 0.82–1.77)
TSH: 1.25 u[IU]/mL (ref 0.450–4.500)

## 2020-06-21 LAB — BASIC METABOLIC PANEL
BUN/Creatinine Ratio: 9 (ref 9–23)
BUN: 7 mg/dL (ref 6–20)
CO2: 19 mmol/L — ABNORMAL LOW (ref 20–29)
Calcium: 9.6 mg/dL (ref 8.7–10.2)
Chloride: 107 mmol/L — ABNORMAL HIGH (ref 96–106)
Creatinine, Ser: 0.8 mg/dL (ref 0.57–1.00)
Glucose: 86 mg/dL (ref 65–99)
Potassium: 4.2 mmol/L (ref 3.5–5.2)
Sodium: 144 mmol/L (ref 134–144)
eGFR: 102 mL/min/{1.73_m2} (ref >59)

## 2020-06-22 DIAGNOSIS — R4589 Other symptoms and signs involving emotional state: Secondary | ICD-10-CM | POA: Insufficient documentation

## 2020-06-22 DIAGNOSIS — D649 Anemia, unspecified: Secondary | ICD-10-CM | POA: Insufficient documentation
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
## 2020-06-22 NOTE — Assessment & Plan Note (Signed)
Microcytic anemia, likely iron deficiency Advised to continue iron and multivitamin supplements Check CBC No signs of active bleeding

## 2020-06-22 NOTE — Assessment & Plan Note (Signed)
Likely situational/adjustment related concern Outdoor activities and resuming her work life would be beneficial, patient agrees. Referred to [facility] therapist

## 2020-06-26 ENCOUNTER — Encounter: Payer: Self-pay | Admitting: Family Medicine

## 2020-06-28 ENCOUNTER — Other Ambulatory Visit: Payer: Self-pay

## 2020-06-28 ENCOUNTER — Encounter: Payer: Self-pay | Admitting: Internal Medicine

## 2020-06-28 ENCOUNTER — Ambulatory Visit: Payer: Medicaid Other | Admitting: Internal Medicine

## 2020-06-28 VITALS — BP 116/71 | HR 84 | Temp 98.3°F | Resp 18 | Ht 68.0 in | Wt 177.4 lb

## 2020-06-28 DIAGNOSIS — L700 Acne vulgaris: Secondary | ICD-10-CM | POA: Diagnosis not present

## 2020-06-28 MED ORDER — ADAPALENE 0.3 % EX GEL: 1.0000 "application " | Freq: Every day | CUTANEOUS | 0 refills | Status: DC
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
## 2020-06-28 NOTE — Patient Instructions (Signed)
Acne  Acne is a skin problem that causes small, red bumps (pimples) and other skin changes. The skin has tiny holes called pores. Each pore has an oil gland. Acne happens when the pores get blocked. The pores may become red, sore, and swollen. They may also become infected. Acne is common among teenagers. Acne usually goes away over time. What are the causes? This condition may be caused when:  Oil glands get blocked by oil, dead skin cells, and dirt.  Bacteria that live in the oil glands increase in number and cause infection. Acne can start with changes in hormones. These changes can occur:  When children mature into their teens (adolescence).  When women get their period (menstrual cycle).  When women are pregnant. Some things can make acne worse. They include:  Cosmetics and hair products that have oil in them.  Stress.  Diseases that cause changes in hormones.  Some medicines.  Headbands, backpacks, or shoulder pads.  Being near certain oils and chemicals.  Foods that are high in sugars. These include dairy products, sweets, and chocolates. What increases the risk? You are more likely to develop this condition if:  You are a teenager.  You have a family history of acne. What are the signs or symptoms? Symptoms of this condition include:  Small, red bumps (pimples or papules).  Whiteheads.  Blackheads.  Small, pus-filled pimples (pustules).  Big, red pimples or pustules that feel tender. Acne that is very bad can cause:  An abscess. This is an area that has pus.  Cysts. These are hard, painful sacs that have fluid.  Scars. These can happen after large pimples heal. How is this treated? Treatment for this condition depends on how bad your acne is. It may include:  Creams and lotions. These can: ? Keep the pores of your skin open. ? Prevent infections and swelling.  Medicines that treat infections (antibiotics). These can be put on your skin or taken  as pills.  Pills that decrease the amount of oil in your skin.  Birth control pills.  Light or laser treatments.  Shots of medicine into the areas with acne.  Chemicals that cause the skin to peel.  Surgery. Follow these instructions at home: Good skin care is the most important thing you can do to treat your acne. Take care of your skin as told by your doctor. You may be told to do these things:  Wash your skin gently at least two times each day. You should also wash your skin: ? After you exercise. ? Before you go to bed.  Use mild soap.  Use a water-based skin moisturizer after you wash your skin.  Use a sunscreen or sunblock with SPF 30 or greater. This is very important if you are using acne medicines.  Choose cosmetics that will not block your oil glands (are noncomedogenic). Medicines  Take over-the-counter and prescription medicines only as told by your doctor.  If you were prescribed an antibiotic medicine, use it or take it as told by your doctor. Do not stop using the antibiotic even if your acne gets better. General instructions  Keep your hair clean and off your face. Shampoo your hair on a regular basis. If you have oily hair, you may need to wash it every day.  Avoid wearing tight headbands or hats.  Avoid picking or squeezing your pimples. That can make your acne worse and cause it to scar.  Shave gently. Only shave when you have to.    Keep a food journal. This can help you see if any foods are linked to your acne.  Keep all follow-up visits as told by your doctor. This is important. Contact a doctor if:  Your acne is not better after eight weeks.  Your acne gets worse.  You have a large area of skin that is red or tender.  You think that you are having side effects from any acne medicine. Summary  Acne is a skin problem that causes pimples. Acne is common among teenagers. Acne usually goes away over time.  Acne starts with changes in your  hormones. Other causes include stress, diet, and some medicines.  Follow your doctor's instructions on how to take care of your skin. Good skin care is the most important thing you can do to treat your acne.  Take over-the-counter and prescription medicines only as told by your doctor.  Contact your doctor if you think that you are having side effects from any acne medicine. This information is not intended to replace advice given to you by your health care provider. Make sure you discuss any questions you have with your health care provider. Document Revised: 06/01/2017 Document Reviewed: 06/01/2017 Elsevier Patient Education  2021 Elsevier Inc.  

## 2020-08-16 ENCOUNTER — Encounter: Payer: Self-pay | Admitting: Internal Medicine

## 2020-08-27 ENCOUNTER — Other Ambulatory Visit: Payer: Self-pay

## 2020-08-27 ENCOUNTER — Encounter: Payer: Self-pay | Admitting: Nurse Practitioner

## 2020-08-27 ENCOUNTER — Ambulatory Visit: Payer: Medicaid Other | Admitting: Nurse Practitioner

## 2020-08-27 DIAGNOSIS — Z7185 Encounter for immunization safety counseling: Secondary | ICD-10-CM | POA: Diagnosis not present
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
## 2020-08-27 NOTE — Assessment & Plan Note (Signed)
-  we discussed risk vs benefit of COVID-19 vaccine -she agrees that immunization does help prevent complications associated with COVID-19 -she is breastfeeding and wants to keep her baby safe -she also has 3 sisters that developed blood clots after taking the COVID vaccine (2 pfizer and 1 moderna) -for her risks are not worth benefits -will provide note that she should be exempted from vaccine mandate

## 2020-08-28 ENCOUNTER — Encounter: Payer: Self-pay | Admitting: Nurse Practitioner

## 2020-08-29 ENCOUNTER — Telehealth: Payer: Self-pay

## 2020-08-29 NOTE — Telephone Encounter (Signed)
COVID exemption Form  Copied Noted Sleeved

## 2020-09-02 ENCOUNTER — Other Ambulatory Visit: Payer: Self-pay | Admitting: Internal Medicine

## 2020-09-02 DIAGNOSIS — L700 Acne vulgaris: Secondary | ICD-10-CM

## 2020-09-02 MED ORDER — ADAPALENE 0.3 % EX GEL: 1.0000 "application " | Freq: Every day | CUTANEOUS | 0 refills | Status: DC

## 2020-09-09 DIAGNOSIS — Z0279 Encounter for issue of other medical certificate: Secondary | ICD-10-CM

## 2020-09-11 NOTE — Telephone Encounter (Signed)
Completed, LM for pt

## 2020-09-11 NOTE — Telephone Encounter (Signed)
Patient picked up forms, asked to be billed.

## 2020-10-14 ENCOUNTER — Encounter: Payer: Self-pay | Admitting: Internal Medicine

## 2020-10-14 ENCOUNTER — Other Ambulatory Visit: Payer: Self-pay | Admitting: *Deleted

## 2020-10-14 DIAGNOSIS — Z30431 Encounter for routine checking of intrauterine contraceptive device: Secondary | ICD-10-CM

## 2020-11-11 ENCOUNTER — Other Ambulatory Visit: Payer: Self-pay | Admitting: Internal Medicine

## 2020-11-11 ENCOUNTER — Encounter: Payer: Self-pay | Admitting: Internal Medicine

## 2020-11-11 DIAGNOSIS — L700 Acne vulgaris: Secondary | ICD-10-CM

## 2020-11-11 MED ORDER — HYDROQUINONE 4 % EX CREA: TOPICAL_CREAM | Freq: Two times a day (BID) | CUTANEOUS | 0 refills | Status: DC

## 2020-11-11 MED ORDER — ADAPALENE 0.3 % EX GEL: 1.0000 "application " | Freq: Every day | CUTANEOUS | 0 refills | Status: DC

## 2020-11-13 ENCOUNTER — Ambulatory Visit: Payer: Medicaid Other | Admitting: Advanced Practice Midwife

## 2020-12-18 ENCOUNTER — Encounter: Payer: Self-pay | Admitting: Internal Medicine

## 2020-12-19 ENCOUNTER — Other Ambulatory Visit: Payer: Self-pay

## 2020-12-19 ENCOUNTER — Encounter: Payer: Self-pay | Admitting: Internal Medicine

## 2020-12-19 ENCOUNTER — Ambulatory Visit (INDEPENDENT_AMBULATORY_CARE_PROVIDER_SITE_OTHER): Payer: Managed Care, Other (non HMO) | Admitting: Internal Medicine

## 2020-12-19 DIAGNOSIS — L700 Acne vulgaris: Secondary | ICD-10-CM

## 2020-12-19 MED ORDER — DOXYCYCLINE HYCLATE 100 MG PO TABS: 100.0000 mg | ORAL_TABLET | Freq: Every day | ORAL | 0 refills | Status: DC

## 2020-12-19 NOTE — Assessment & Plan Note (Addendum)
Uses adapalene and hydroquinone cream Started doxycycline for short term for resistant acne Continue using hypoallergenic face wash
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
## 2020-12-19 NOTE — Patient Instructions (Signed)
Please take Doxycycline once daily.  Continue using Adapalene cream.

## 2021-01-01 ENCOUNTER — Other Ambulatory Visit: Payer: Self-pay | Admitting: Internal Medicine

## 2021-01-01 DIAGNOSIS — L700 Acne vulgaris: Secondary | ICD-10-CM

## 2021-01-02 MED ORDER — ADAPALENE 0.3 % EX GEL: 1.0000 "application " | Freq: Every day | CUTANEOUS | 0 refills | Status: DC

## 2021-01-02 MED ORDER — HYDROQUINONE 4 % EX CREA: TOPICAL_CREAM | Freq: Two times a day (BID) | CUTANEOUS | 0 refills | Status: DC

## 2021-01-08 ENCOUNTER — Ambulatory Visit: Payer: Medicaid Other | Admitting: Advanced Practice Midwife

## 2021-01-09 ENCOUNTER — Other Ambulatory Visit: Payer: Self-pay | Admitting: Internal Medicine

## 2021-01-09 ENCOUNTER — Encounter: Payer: Self-pay | Admitting: Internal Medicine

## 2021-01-09 DIAGNOSIS — L700 Acne vulgaris: Secondary | ICD-10-CM

## 2021-01-09 MED ORDER — DIFFERIN 0.3 % EX GEL: 1.0000 "application " | Freq: Every day | CUTANEOUS | 2 refills | Status: DC

## 2021-01-21 ENCOUNTER — Ambulatory Visit: Payer: Medicaid Other | Admitting: Adult Health

## 2021-01-21 ENCOUNTER — Encounter: Payer: Self-pay | Admitting: Internal Medicine

## 2021-03-25 ENCOUNTER — Encounter: Payer: Self-pay | Admitting: Obstetrics & Gynecology

## 2021-03-26 ENCOUNTER — Ambulatory Visit: Payer: Medicaid Other | Admitting: Adult Health

## 2021-03-31 ENCOUNTER — Encounter: Payer: Self-pay | Admitting: Internal Medicine

## 2021-04-01 ENCOUNTER — Encounter: Payer: Self-pay | Admitting: Internal Medicine

## 2021-04-01 ENCOUNTER — Ambulatory Visit: Payer: Medicaid Other | Admitting: Internal Medicine

## 2021-04-01 ENCOUNTER — Other Ambulatory Visit: Payer: Self-pay

## 2021-04-01 DIAGNOSIS — L700 Acne vulgaris: Secondary | ICD-10-CM | POA: Diagnosis not present

## 2021-04-01 MED ORDER — ADAPALENE 0.3 % EX GEL
1.0000 | Freq: Every day | CUTANEOUS | 2 refills | Status: DC
Start: 2021-04-01 — End: 2021-04-08

## 2021-04-01 MED ORDER — HYDROQUINONE 4 % EX CREA
TOPICAL_CREAM | Freq: Two times a day (BID) | CUTANEOUS | 0 refills | Status: DC
Start: 2021-04-01 — End: 2021-09-06

## 2021-04-01 MED ORDER — DOXYCYCLINE HYCLATE 100 MG PO TABS: 100.0000 mg | ORAL_TABLET | Freq: Every day | ORAL | 0 refills | Status: DC
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
## 2021-04-01 NOTE — Patient Instructions (Signed)
Please start taking Doxycycline as prescribed.  Please use Adapalene and Hydroquine as prescribed.

## 2021-04-01 NOTE — Assessment & Plan Note (Addendum)
Uses adapalene and hydroquinone cream Started doxycycline for short term for resistant acne Continue using hypoallergenic face wash If persistent concern, will refer to Dermatology

## 2021-04-02 ENCOUNTER — Telehealth: Payer: Managed Care, Other (non HMO) | Admitting: Internal Medicine

## 2021-04-08 ENCOUNTER — Encounter: Payer: Self-pay | Admitting: Internal Medicine

## 2021-04-08 MED ORDER — DIFFERIN 0.3 % EX GEL
1.0000 | Freq: Every day | CUTANEOUS | 2 refills | Status: DC
Start: 2021-04-08 — End: 2022-07-28

## 2021-04-08 NOTE — Addendum Note (Signed)
Addended byTrena Platt on: 04/08/2021 12:09 PM   Modules accepted: Orders

## 2021-04-10 ENCOUNTER — Ambulatory Visit
Admission: EM | Admit: 2021-04-10 | Discharge: 2021-04-10 | Disposition: A | Discharge status: Home / Self Care | Payer: Medicaid Other | Attending: Urgent Care | Admitting: Urgent Care

## 2021-04-10 ENCOUNTER — Encounter: Payer: Self-pay | Admitting: Emergency Medicine

## 2021-04-10 ENCOUNTER — Other Ambulatory Visit: Payer: Self-pay

## 2021-04-10 DIAGNOSIS — R0982 Postnasal drip: Secondary | ICD-10-CM

## 2021-04-10 DIAGNOSIS — R070 Pain in throat: Secondary | ICD-10-CM | POA: Diagnosis not present

## 2021-04-10 DIAGNOSIS — J309 Allergic rhinitis, unspecified: Secondary | ICD-10-CM | POA: Diagnosis not present

## 2021-04-10 MED ORDER — LEVOCETIRIZINE DIHYDROCHLORIDE 5 MG PO TABS
5.0000 mg | ORAL_TABLET | Freq: Every evening | ORAL | 0 refills | Status: DC
Start: 2021-04-10 — End: 2021-06-06

## 2021-04-10 MED ORDER — PSEUDOEPHEDRINE HCL 60 MG PO TABS: 60.0000 mg | ORAL_TABLET | Freq: Three times a day (TID) | ORAL | 0 refills | Status: DC | PRN

## 2021-04-10 NOTE — ED Triage Notes (Signed)
Feels like something is stuck in throat.  States she noticed it after taking a dose of doxycycline since Sunday.   States it hurts more to eat.   ?
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
## 2021-04-28 ENCOUNTER — Ambulatory Visit: Payer: Medicaid Other | Admitting: Women's Health

## 2021-04-28 ENCOUNTER — Encounter: Payer: Self-pay | Admitting: Women's Health

## 2021-05-05 ENCOUNTER — Ambulatory Visit: Payer: Medicaid Other | Admitting: Women's Health

## 2021-05-16 ENCOUNTER — Ambulatory Visit: Payer: Medicaid Other | Admitting: Women's Health

## 2021-06-06 ENCOUNTER — Other Ambulatory Visit (HOSPITAL_COMMUNITY)
Admission: RE | Admit: 2021-06-06 | Discharge: 2021-06-06 | Disposition: A | Discharge status: Home / Self Care | Payer: Medicaid Other | Source: Ambulatory Visit | Attending: Obstetrics & Gynecology | Admitting: Obstetrics & Gynecology

## 2021-06-06 ENCOUNTER — Encounter: Payer: Self-pay | Admitting: Obstetrics & Gynecology

## 2021-06-06 ENCOUNTER — Ambulatory Visit: Payer: Medicaid Other | Admitting: Obstetrics & Gynecology

## 2021-06-06 VITALS — BP 102/68 | HR 61 | Ht 68.0 in | Wt 175.4 lb

## 2021-06-06 DIAGNOSIS — Z975 Presence of (intrauterine) contraceptive device: Secondary | ICD-10-CM

## 2021-06-06 DIAGNOSIS — Z01419 Encounter for gynecological examination (general) (routine) without abnormal findings: Secondary | ICD-10-CM | POA: Diagnosis not present

## 2021-06-06 DIAGNOSIS — Z0000 Encounter for general adult medical examination without abnormal findings: Secondary | ICD-10-CM | POA: Diagnosis not present

## 2021-06-06 DIAGNOSIS — Z113 Encounter for screening for infections with a predominantly sexual mode of transmission: Secondary | ICD-10-CM

## 2021-06-06 NOTE — Addendum Note (Signed)
Addended by: Leilani Able, Satina Jerrell A on: 06/06/2021 10:17 AM ? ? Modules accepted: Orders ? ?

## 2021-06-06 NOTE — Progress Notes (Signed)
? ?WELL-WOMAN EXAMINATION ?Patient name: Marie Wade MRN [id number]  Date of birth: 11-15-1990 ?Chief Complaint:   ?Gynecologic Exam ? ?History of Present Illness:   ?Marie Wade is a 31 y.o. [phone]  female being seen today for a routine well-woman exam.  ?Today she notes no acute complaints ? ?Paragard- notes moderate to heavy bleeding.  Denies pelvic pain or intermenstrual bleeding. ? ?Patient's last menstrual period was 05/30/2021 (exact date). ?Denies issues with her menses ?The current method of family planning is  Paragard IUD- placed 02/2020 .  ? ? ?Last pap 2020.  ?Last mammogram: n/a . ?Last colonoscopy: n/a ? ? ?  06/06/2021  ?  9:43 AM 04/01/2021  ?  3:22 PM 12/19/2020  ?  7:33 AM 08/27/2020  ?  4:03 PM 06/28/2020  ? 11:23 AM  ?Depression screen PHQ 2/9  ?Decreased Interest 0 0 0 0 0  ?Down, Depressed, Hopeless 0 0 0 0 0  ?PHQ - 2 Score 0 0 0 0 0  ?Altered sleeping 0      ?Tired, decreased energy 0      ?Change in appetite 0      ?Feeling bad or failure about yourself  0      ?Trouble concentrating 0      ?Moving slowly or fidgety/restless 0      ?Suicidal thoughts 0      ?PHQ-9 Score 0      ?Difficult doing work/chores    Not difficult at all Not difficult at all  ? ? ? ? ?Review of Systems:   ?Pertinent items are noted in HPI ?Denies any headaches, blurred vision, fatigue, shortness of breath, chest pain, abdominal pain, bowel movements, urination, or intercourse unless otherwise stated above. ? ?Pertinent History Reviewed:  ?Reviewed past medical,surgical, social and family history.  ?Reviewed problem list, medications and allergies. ?Physical Assessment:  ? ?Vitals:  ? 06/06/21 0939  ?BP: 102/68  ?Pulse: 61  ?Weight: 175 lb 6.4 oz (79.6 kg)  ?Height: 5\' 8"  (1.727 m)  ?Body mass index is 26.67 kg/m?. ?  ?     Physical Examination:  ? General appearance - well appearing, and in no distress ? Mental status - alert, oriented to person, place, and time ? Psych:  She has a normal mood and affect ? Skin -  warm and dry, normal color, no suspicious lesions noted ? Chest - effort normal, all lung fields clear to auscultation bilaterally ? Heart - normal rate and regular rhythm ? Neck:  midline trachea, no thyromegaly or nodules ? Breasts - breasts appear normal, no suspicious masses, no skin or nipple changes or  axillary nodes ? Abdomen - soft, nontender, nondistended, no masses or organomegaly ? Pelvic - VULVA: normal appearing vulva with no masses, tenderness or lesions  VAGINA: normal appearing vagina with normal color and discharge, no lesions  CERVIX: normal appearing cervix without discharge or lesions, no CMT, strings visualized ? Thin prep pap is done with HR HPV cotesting ? UTERUS: uterus is felt to be normal size, shape, consistency and nontender  ? ADNEXA: No adnexal masses or tenderness noted. ? Extremities:  No swelling or varicosities noted ? ?Chaperone:   ? ? ?Assessment & Plan:  ?1) Well-Woman Exam ?-pap collected, reviewed guideilnes ?-STI screening to be completed ? ?2) Paragard ?-no issues, doing well ? ?Orders Placed This Encounter  ?Procedures  ? HIV Antibody (routine testing w rflx)  ? RPR  ? ? ?Meds: No orders of the defined types  were placed in this encounter. ? ? ?Follow-up: Return in about 1 year (around 06/07/2022) for Annual. ? ? ?Myna Hidalgo, DO ?Attending Obstetrician & Gynecologist, Faculty Practice ?[facility] for [employer], [facility] ? ? ?

## 2021-06-07 LAB — RPR: RPR Ser Ql: NONREACTIVE

## 2021-06-07 LAB — HIV ANTIBODY (ROUTINE TESTING W REFLEX): HIV Screen 4th Generation wRfx: NONREACTIVE

## 2021-06-09 LAB — CYTOLOGY - PAP
Comment: NEGATIVE
Diagnosis: NEGATIVE
High risk HPV: NEGATIVE

## 2021-09-06 ENCOUNTER — Other Ambulatory Visit: Payer: Self-pay | Admitting: Internal Medicine

## 2021-09-06 DIAGNOSIS — L700 Acne vulgaris: Secondary | ICD-10-CM

## 2021-09-08 MED ORDER — HYDROQUINONE 4 % EX CREA: TOPICAL_CREAM | Freq: Two times a day (BID) | CUTANEOUS | 0 refills | Status: AC

## 2021-11-02 IMAGING — MR MR LUMBAR SPINE WO/W CM
4 of 9 series · 18 of 48 positions shown · IV contrast (gadavist)
Comparison: None available.

CLINICAL DATA: Initial evaluation for palpable abnormality at lower
back, soft tissue mass.

EXAM:
MRI LUMBAR SPINE WITHOUT AND WITH CONTRAST
TECHNIQUE: Multiplanar and multiecho pulse sequences of the lumbar spine were
obtained without and with intravenous contrast.
CONTRAST:  8.5mL GADAVIST GADOBUTROL 1 MMOL/ML IV SOLN

[Series 2: T2 · sagittal · 4.0mm · 0.55mm/px · 4 of 15 slices shown (1 of 3)]
[im 1/15]
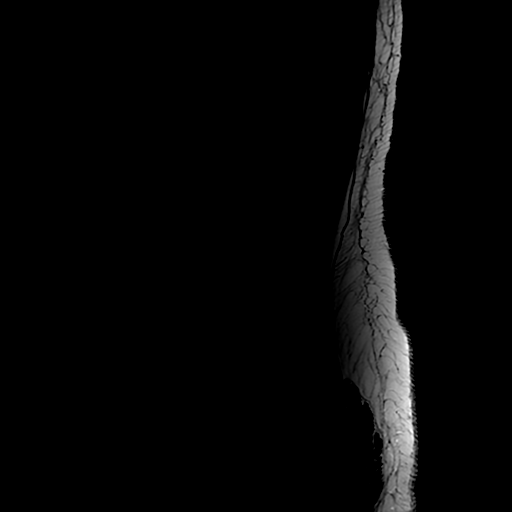
[im 5/15]
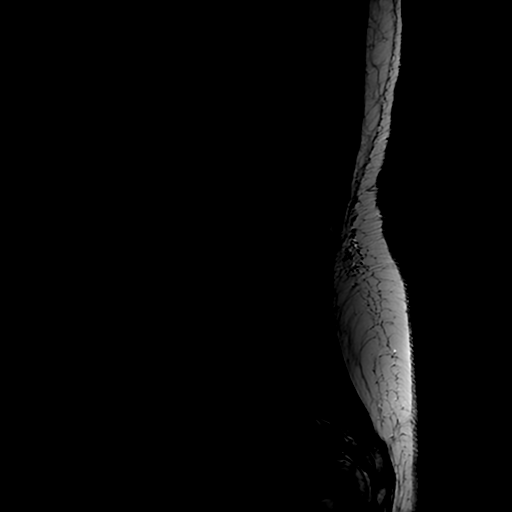
[im 10/15]
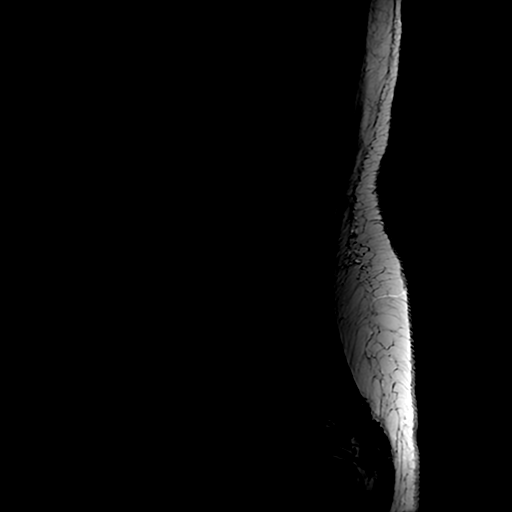
[im 15/15]
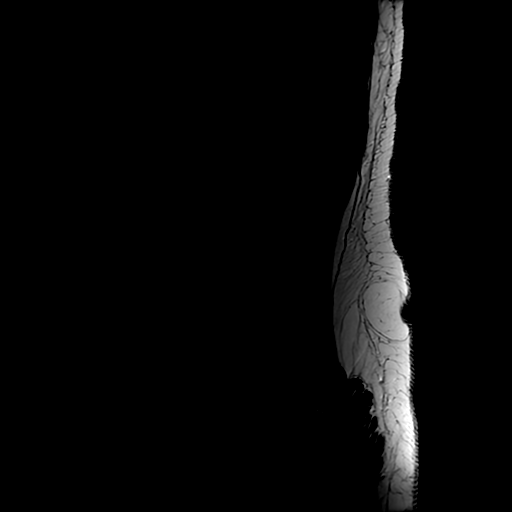

[Series 4: T1 · sagittal · 4.0mm · 0.51mm/px · 3 of 15 slices shown]
[im 1/15]
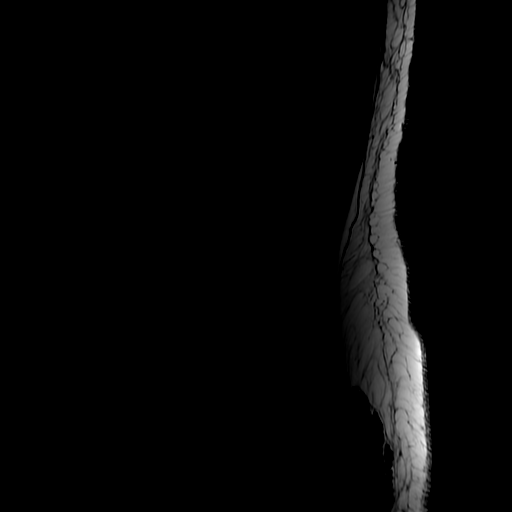
[im 10/15]
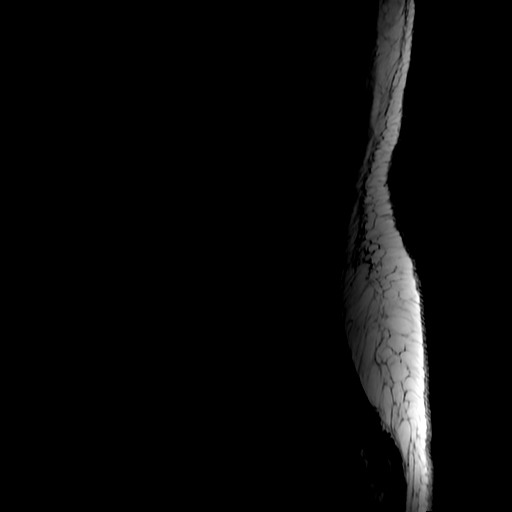
[im 15/15]
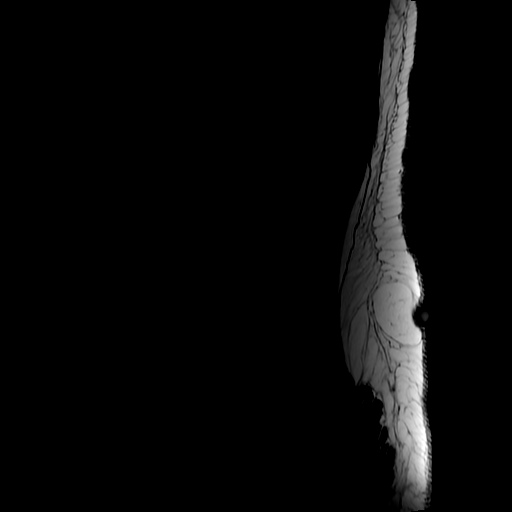

[Series 5: T2 · axial · 4.0mm · 0.43mm/px · z∈[-46,+156]mm · 8 of 37 slices shown (2 of 3)]
[im 1/37]
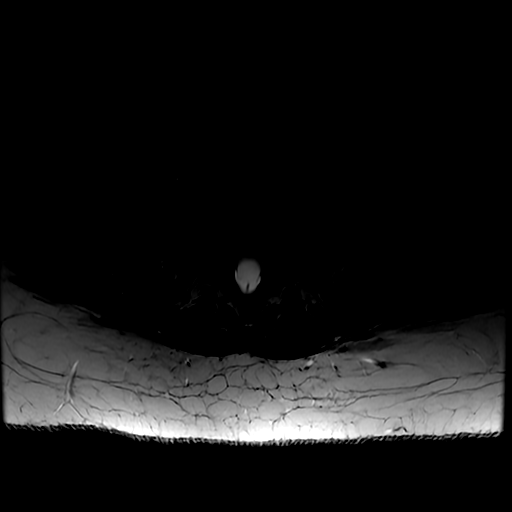
[im 6/37]
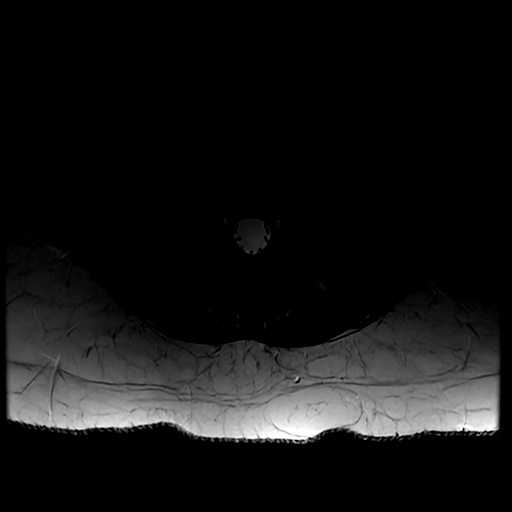
[im 11/37]
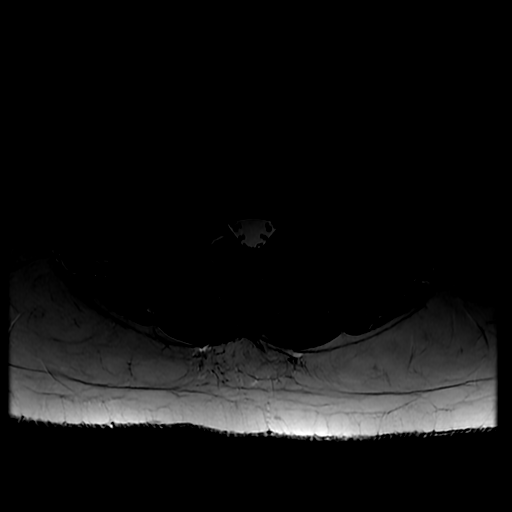
[im 16/37]
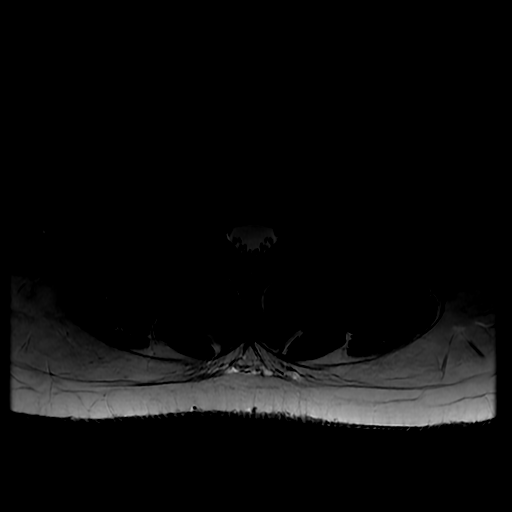
[im 21/37]
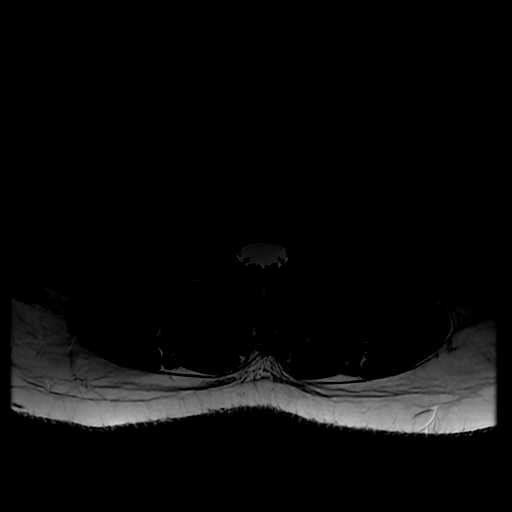
[im 26/37]
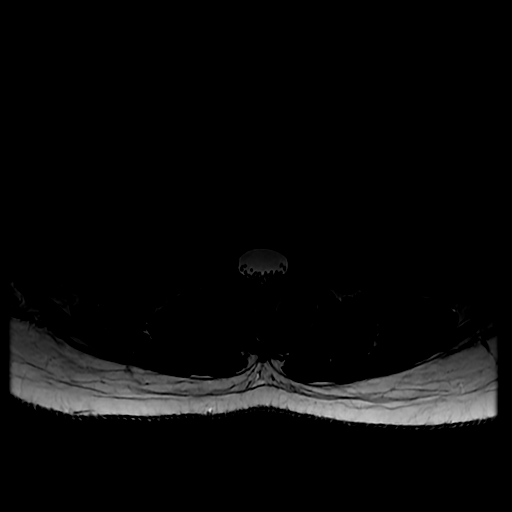
[im 31/37]
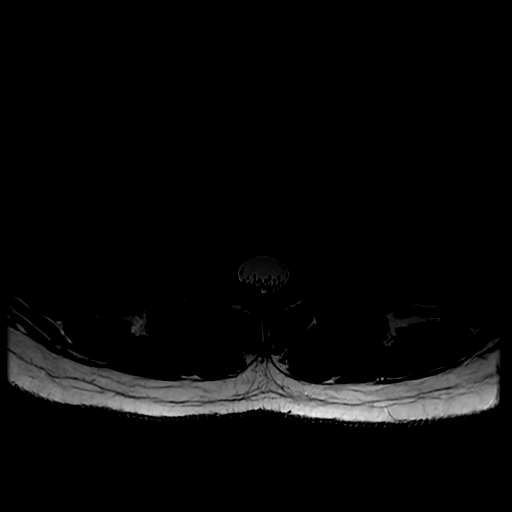
[im 37/37]
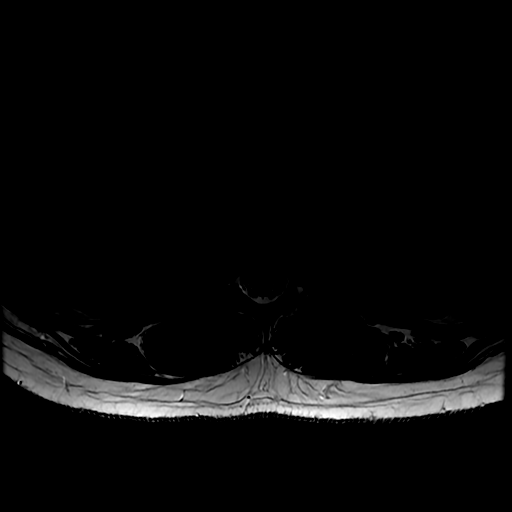

[Series 8: T2 · sagittal · 4.0mm · 0.55mm/px · 3 of 18 slices shown (3 of 3)]
[im 1/18]
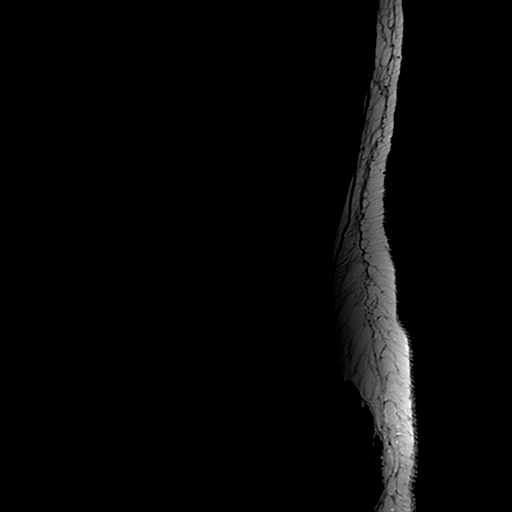
[im 12/18]
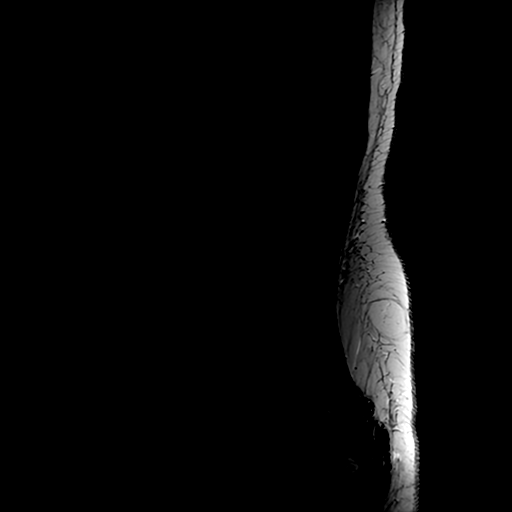
[im 18/18]
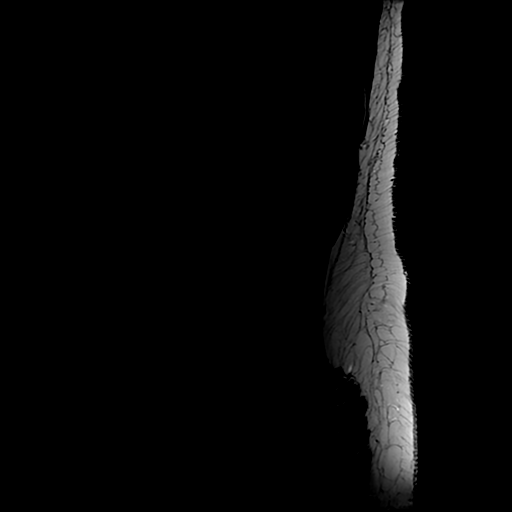

[18 of 48 positions shown; findings below may reference images not displayed]

FINDINGS: Segmentation: Standard. Lowest well-formed disc space labeled the
L5-S1 level.

Alignment: Physiologic with preservation of the normal lumbar
lordosis. No listhesis.

Vertebrae: Vertebral body height well maintained without acute or
chronic fracture. Bone marrow signal intensity within normal limits.
Subcentimeter benign hemangioma noted within the L1 vertebral body.
No other discrete or worrisome osseous lesions. No abnormal marrow
edema or enhancement.

Conus medullaris and cauda equina: Conus extends to the L1 level.
Conus and cauda equina appear normal. Incidental note made of a tiny
5 a filum terminale.

Paraspinal and other soft tissues: Vitamin-E marker overlies the
palpable abnormality of concern at the left paramedian lower back at
approximately the L5 level. There is an underlying oval weight
well-circumscribed fat density mass within the subcutaneous fat,
measuring 4.0 x 2.2 x 3.4 cm (transverse by AP by craniocaudad),
consistent with a benign lipoma. No significant intrinsic
enhancement or other concerning features. Paraspinous soft tissues
are otherwise normal. Visualized visceral structures within normal
limits.

Disc levels:

No significant disc pathology seen within the lumbar spine.
Intervertebral discs are well hydrated with preserved disc height.
No significant disc bulge or focal disc herniation. No significant
facet pathology. No canal or neural foraminal stenosis or evidence
for neural impingement.
IMPRESSION: 1. 4.0 x 2.2 x 3.4 cm fatty mass involving the subcutaneous fat of
the lower back, consistent with a benign lipoma.
2. Otherwise unremarkable and normal MRI of the lumbar spine.

## 2021-11-26 ENCOUNTER — Ambulatory Visit (INDEPENDENT_AMBULATORY_CARE_PROVIDER_SITE_OTHER): Payer: Medicaid Other

## 2021-11-26 DIAGNOSIS — Z23 Encounter for immunization: Secondary | ICD-10-CM | POA: Diagnosis not present

## 2022-01-19 ENCOUNTER — Encounter: Payer: Self-pay | Admitting: Internal Medicine

## 2022-02-09 ENCOUNTER — Ambulatory Visit: Payer: Medicaid Other | Admitting: Internal Medicine

## 2022-02-09 ENCOUNTER — Encounter: Payer: Self-pay | Admitting: Internal Medicine

## 2022-02-09 VITALS — BP 97/66 | HR 66 | Resp 16 | Ht 68.0 in | Wt 174.0 lb

## 2022-02-09 DIAGNOSIS — Z0289 Encounter for other administrative examinations: Secondary | ICD-10-CM | POA: Insufficient documentation

## 2022-02-09 NOTE — Progress Notes (Signed)
   HPI:Marie Wade is a 32 y.o. female who presents for proof of MMR immunity and record of tdap. Her treatment of acne is going well. She follows with Ob/Gyn and using IUD for birth control. No other concerns today.   Physical Exam: Vitals:   02/09/22 0944  BP: 97/66  Pulse: 66  Resp: 16  SpO2: 97%  Weight: 174 lb (78.9 kg)  Height: 5\' 8"  (1.727 m)     Physical Exam Constitutional:      Appearance: She is well-developed and well-groomed.  Eyes:     General: No scleral icterus.    Conjunctiva/sclera: Conjunctivae normal.  Cardiovascular:     Rate and Rhythm: Normal rate and regular rhythm.     Heart sounds: No murmur heard.    No friction rub. No gallop.  Pulmonary:     Effort: Pulmonary effort is normal.     Breath sounds: No wheezing, rhonchi or rales.  Musculoskeletal:     Right lower leg: No edema.     Left lower leg: No edema.  Skin:    General: Skin is warm and dry.      Assessment & Plan:   Encounter for review of form with patient Patient presents with request of MMR and TDAP records. 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  She is up to date on MMR and TDAP. Records provided and letter written saying patient is up to date on vaccines.      Lorene Dy, MD

## 2022-02-09 NOTE — Assessment & Plan Note (Signed)
Patient presents with request of MMR and TDAP records. 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  She is up to date on MMR and TDAP. Records provided and letter written saying patient is up to date on vaccines.

## 2022-02-09 NOTE — Patient Instructions (Signed)
Thank you for trusting me with your care. To recap, today we discussed the following:   You are up to date on vaccinations. Letter and vaccination history provided. Follow up with Dr.Patel at your next planned yearly checkup.

## 2022-02-25 ENCOUNTER — Ambulatory Visit: Payer: Medicaid Other | Admitting: Advanced Practice Midwife

## 2022-02-25 ENCOUNTER — Encounter: Payer: Self-pay | Admitting: Advanced Practice Midwife

## 2022-02-25 VITALS — BP 102/67 | HR 66 | Ht 68.0 in | Wt 175.4 lb

## 2022-02-25 DIAGNOSIS — Z30430 Encounter for insertion of intrauterine contraceptive device: Secondary | ICD-10-CM

## 2022-02-25 DIAGNOSIS — Z975 Presence of (intrauterine) contraceptive device: Secondary | ICD-10-CM

## 2022-02-25 DIAGNOSIS — Z30432 Encounter for removal of intrauterine contraceptive device: Secondary | ICD-10-CM

## 2022-02-25 MED ORDER — PHEXXI 1.8-1-0.4 % VA GEL: 1.0000 | Freq: Every day | VAGINAL | 3 refills | Status: AC

## 2022-02-25 NOTE — Progress Notes (Signed)
   IUD REMOVAL  Patient name: Marie Wade MRN [id number]  Date of birth: 1990/12/09 Subjective Findings:   Marie Wade is a 32 y.o. [id number] African American female being seen today for removal of a Paragard  IUD. Her IUD was placed in Jan 2022.  She desires removal because of heavy cycles (bleeding through clothing). Signed copy of informed consent in chart.  No LMP recorded. (Menstrual status: IUD). Last pap May 2023. Results were: NILM w/ HRHPV negative The planned method of family planning is may want to try Phexxi     02/09/2022    9:48 AM 06/06/2021    9:43 AM 04/01/2021    3:22 PM 12/19/2020    7:33 AM 08/27/2020    4:03 PM  Depression screen PHQ 2/9  Decreased Interest 0 0 0 0 0  Down, Depressed, Hopeless 0 0 0 0 0  PHQ - 2 Score 0 0 0 0 0  Altered sleeping  0     Tired, decreased energy  0     Change in appetite  0     Feeling bad or failure about yourself   0     Trouble concentrating  0     Moving slowly or fidgety/restless  0     Suicidal thoughts  0     PHQ-9 Score  0     Difficult doing work/chores     Not difficult at all        06/17/2020   11:17 AM 04/04/2020   12:13 PM 02/14/2020   10:22 AM 02/14/2020    9:08 AM  GAD 7 : Generalized Anxiety Score  Nervous, Anxious, on Edge 0 0 0 0  Control/stop worrying 0 0 0 0  Worry too much - different things 0 0 0 0  Trouble relaxing 0 0 2 2  Restless 0 0 0 0  Easily annoyed or irritable 0 0 2 2  Afraid - awful might happen 0 0 0 0  Total GAD 7 Score 0 0 4 4  Anxiety Difficulty Not difficult at all        Pertinent History Reviewed:   Reviewed past medical,surgical, social, obstetrical and family history.  Reviewed problem list, medications and allergies. Objective Findings & Procedure:    Vitals:   02/25/22 0832  BP: 102/67  Pulse: 66  Weight: 175 lb 6.4 oz (79.6 kg)  Height: 5\' 8"  (1.727 m)  Body mass index is 26.67 kg/m.  No results found for this or any previous visit (from the past 24 hour(s)).    Time out was performed.  A Pederson speculum was placed in the vagina.  The cervix was visualized, and the strings were visible. They were grasped and the Paragard  IUD was easily removed intact without complications. The patient tolerated the procedure well.   Chaperone: Celene Squibb   Assessment & Plan:   1) Paragard  IUD removal Follow-up prn problems; wants only nonhormonal contraception (hormones make her break out)  2) May want to try Phexxi, pamphlet given and rx sent in to [place] [facility]  No orders of the defined types were placed in this encounter.   Follow-up: Return in about 1 year (around 02/26/2023) for Physical.  Myrtis Ser [facility] 02/25/2022 8:56 AM

## 2022-07-27 ENCOUNTER — Encounter: Payer: Self-pay | Admitting: Internal Medicine

## 2022-07-28 ENCOUNTER — Encounter: Payer: Self-pay | Admitting: Internal Medicine

## 2022-07-28 ENCOUNTER — Telehealth (INDEPENDENT_AMBULATORY_CARE_PROVIDER_SITE_OTHER): Payer: Self-pay | Admitting: Internal Medicine

## 2022-07-28 DIAGNOSIS — L700 Acne vulgaris: Secondary | ICD-10-CM

## 2022-07-28 MED ORDER — ADAPALENE 0.3 % EX GEL
1.0000 | Freq: Every day | CUTANEOUS | 2 refills | Status: DC
Start: 2022-07-28 — End: 2022-12-07

## 2022-07-28 MED ORDER — DOXYCYCLINE HYCLATE 100 MG PO TABS: 100.0000 mg | ORAL_TABLET | Freq: Two times a day (BID) | ORAL | 0 refills | Status: DC

## 2022-07-28 NOTE — Patient Instructions (Signed)
Please start taking doxycycline as prescribed.  Please use Differin cream as directed for acne.
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
## 2022-07-28 NOTE — Assessment & Plan Note (Signed)
Uses adapalene and hydroquinone cream, refilled adapalene gel Started doxycycline for short term for resistant acne Continue using hypoallergenic face wash If persistent concern, will refer to Dermatology

## 2022-08-10 ENCOUNTER — Ambulatory Visit: Payer: Medicaid Other | Admitting: Internal Medicine

## 2022-09-30 ENCOUNTER — Telehealth: Payer: Self-pay | Admitting: Internal Medicine

## 2022-09-30 NOTE — Telephone Encounter (Signed)
Printed and at the front desk, patient advised

## 2022-09-30 NOTE — Telephone Encounter (Signed)
Patient called needs copy of her immunization she will come by and pick up. Patient asked for nurse to return call once it is ready for pick up. Call back # [phone].

## 2022-10-08 ENCOUNTER — Encounter: Payer: Self-pay | Admitting: Internal Medicine

## 2022-10-08 ENCOUNTER — Telehealth (INDEPENDENT_AMBULATORY_CARE_PROVIDER_SITE_OTHER): Payer: Self-pay | Admitting: Family Medicine

## 2022-10-08 ENCOUNTER — Encounter: Payer: Self-pay | Admitting: Family Medicine

## 2022-10-08 ENCOUNTER — Other Ambulatory Visit: Payer: Self-pay | Admitting: Family Medicine

## 2022-10-08 DIAGNOSIS — R509 Fever, unspecified: Secondary | ICD-10-CM

## 2022-10-08 MED ORDER — ACETAMINOPHEN ER 650 MG PO TBCR: 650.0000 mg | EXTENDED_RELEASE_TABLET | Freq: Three times a day (TID) | ORAL | 3 refills | Status: AC | PRN

## 2022-10-08 MED ORDER — PREDNISONE 20 MG PO TABS: 20.0000 mg | ORAL_TABLET | Freq: Two times a day (BID) | ORAL | 0 refills | Status: AC

## 2022-10-08 MED ORDER — AZELASTINE-FLUTICASONE 137-50 MCG/ACT NA SUSP: 1.0000 | Freq: Two times a day (BID) | NASAL | 1 refills | Status: AC

## 2022-10-08 NOTE — Assessment & Plan Note (Signed)
Ordered COVID-19, Flu A+B and RSV, Rapid Strep- awaiting results will follow up. Prednisone 20 mg twice daily x 5 days Advise Symptomatic treatment, rest, increase oral fluid intake. Take OTC tylenol 650 mg every 8 hours for pain or fever. Follow-up for worsening or persistent symptoms. Patient verbalizes understanding regarding plan of care and all questions answered

## 2022-10-08 NOTE — Addendum Note (Signed)
Addended by: Abner Greenspan on: 10/08/2022 02:58 PM   Modules accepted: Orders

## 2022-10-08 NOTE — Progress Notes (Addendum)
Virtual Visit via Video Note  I connected with Marie Wade on 10/08/22 at  1:00 PM EDT by a video enabled telemedicine application and verified that I am speaking with the correct person using two identifiers.  Patient Location: Home Provider Location: Office/Clinic  I discussed the limitations, risks, security, and privacy concerns of performing an evaluation and management service by video and the availability of in person appointments. I also discussed with the patient that there may be a patient responsible charge related to this service. The patient expressed understanding and agreed to proceed.  Subjective: PCP: Anabel Halon, MD  Chief Complaint  Patient presents with   URI    Bad cold-fevers, stuffy nose, sore throat, and headaches. Started 10/07/2022.   Marie Wade 32 year old female, prevents via tele-health. Patient complains of fever last reading 100 F . Patient describes symptoms of fatigue, headache, scratchy throat, and night sweats. Symptoms began 1 day ago and are gradually worsening since that time. Patient denies chest pain or nausea and vomiting. Treatment thus far includes Nyquil with no improvement.      ROS: Per HPI  Current Outpatient Medications:    acetaminophen (TYLENOL 8 HOUR) 650 MG CR tablet, Take 1 tablet (650 mg total) by mouth every 8 (eight) hours as needed for pain., Disp: 30 tablet, Rfl: 3   Adapalene (DIFFERIN) 0.3 % gel, Apply 1 application  topically at bedtime., Disp: 45 g, Rfl: 2   Azelastine-Fluticasone 137-50 MCG/ACT SUSP, Place 1 spray into the nose every 12 (twelve) hours., Disp: 23 g, Rfl: 1   doxycycline (VIBRA-TABS) 100 MG tablet, Take 1 tablet (100 mg total) by mouth 2 (two) times daily., Disp: 60 tablet, Rfl: 0   hydroquinone 4 % cream, Apply topically 2 (two) times daily., Disp: 28.35 g, Rfl: 0   predniSONE (DELTASONE) 20 MG tablet, Take 1 tablet (20 mg total) by mouth 2 (two) times daily with a meal for 5 days., Disp: 10  tablet, Rfl: 0   Lactic Ac-Citric Ac-Pot Bitart (PHEXXI) 1.8-1-0.4 % GEL, Place 1 applicator vaginally daily. Up to 1hr prior to having sex, Disp: 120 g, Rfl: 3  Observations/Objective: There were no vitals filed for this visit. Physical Exam. Patient is alert and no acute distress noted.   Assessment and Plan: Fever, unspecified fever cause Assessment & Plan: Ordered COVID-19, Flu A+B and RSV, Rapid Strep- awaiting results will follow up. Prednisone 20 mg twice daily x 5 days Advise Symptomatic treatment, rest, increase oral fluid intake. Take OTC tylenol 650 mg every 8 hours for pain or fever. Follow-up for worsening or persistent symptoms. Patient verbalizes understanding regarding plan of care and all questions answered    Orders: -     COVID-19, Flu A+B and RSV -     POCT rapid strep A  Other orders -     predniSONE; Take 1 tablet (20 mg total) by mouth 2 (two) times daily with a meal for 5 days.  Dispense: 10 tablet; Refill: 0 -     Azelastine-Fluticasone; Place 1 spray into the nose every 12 (twelve) hours.  Dispense: 23 g; Refill: 1 -     Acetaminophen ER; Take 1 tablet (650 mg total) by mouth every 8 (eight) hours as needed for pain.  Dispense: 30 tablet; Refill: 3    Follow Up Instructions: No follow-ups on file.   I discussed the assessment and treatment plan with the patient. The patient was provided an opportunity to ask questions, and all were  answered. The patient agreed with the plan and demonstrated an understanding of the instructions.   The patient was advised to call back or seek an in-person evaluation if the symptoms worsen or if the condition fails to improve as anticipated.  The above assessment and management plan was discussed with the patient. The patient verbalized understanding of and has agreed to the management plan.   Cruzita Lederer Newman Nip, FNP

## 2022-10-09 LAB — RAPID STREP SCREEN (MED CTR MEBANE ONLY): Strep Gp A Ag, IA W/Reflex: NEGATIVE

## 2022-10-09 LAB — CULTURE, GROUP A STREP

## 2022-10-11 LAB — COVID-19, FLU A+B AND RSV
Influenza A, NAA: NOT DETECTED
Influenza B, NAA: NOT DETECTED
RSV, NAA: NOT DETECTED
SARS-CoV-2, NAA: DETECTED — AB

## 2022-12-07 ENCOUNTER — Encounter: Payer: Self-pay | Admitting: Internal Medicine

## 2022-12-07 ENCOUNTER — Telehealth (INDEPENDENT_AMBULATORY_CARE_PROVIDER_SITE_OTHER): Payer: 59 | Admitting: Internal Medicine

## 2022-12-07 DIAGNOSIS — L700 Acne vulgaris: Secondary | ICD-10-CM

## 2022-12-07 MED ORDER — ADAPALENE 0.3 % EX GEL: 1.0000 "application " | Freq: Every day | CUTANEOUS | 2 refills | Status: AC

## 2022-12-07 MED ORDER — DOXYCYCLINE HYCLATE 100 MG PO TABS
100.0000 mg | ORAL_TABLET | Freq: Two times a day (BID) | ORAL | 0 refills | Status: AC
Start: 2022-12-07 — End: ?

## 2022-12-07 NOTE — Assessment & Plan Note (Signed)
Uses adapalene and hydroquinone cream, refilled adapalene gel Started doxycycline for short term for resistant acne Continue using hypoallergenic face wash Due to persistent concern, will refer to Dermatology
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
## 2023-02-18 ENCOUNTER — Ambulatory Visit: Payer: 59

## 2023-02-18 ENCOUNTER — Ambulatory Visit
Admission: EM | Admit: 2023-02-18 | Discharge: 2023-02-18 | Disposition: A | Discharge status: Home / Self Care | Payer: 59 | Attending: Nurse Practitioner | Admitting: Nurse Practitioner

## 2023-02-18 DIAGNOSIS — S92511A Displaced fracture of proximal phalanx of right lesser toe(s), initial encounter for closed fracture: Secondary | ICD-10-CM
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
## 2023-02-18 NOTE — Discharge Instructions (Signed)
Your right fourth toe is broken.  Wear the post op shoe to help offset pressure and help with healing.  Take Tylenol/ibuprofen for pain and apply ice 15 minutes on, 45 minutes off every hour while awake to help with pain and swelling.  Follow up with Podiatry if symptoms do not improve with treatment.

## 2023-02-18 NOTE — ED Triage Notes (Signed)
Pt reports she hit her right foot on her bed frame this morning.

## 2023-03-01 ENCOUNTER — Encounter: Payer: Self-pay | Admitting: Podiatry

## 2023-03-01 ENCOUNTER — Ambulatory Visit (INDEPENDENT_AMBULATORY_CARE_PROVIDER_SITE_OTHER): Payer: BC Managed Care – PPO | Admitting: Podiatry

## 2023-03-01 DIAGNOSIS — S92501A Displaced unspecified fracture of right lesser toe(s), initial encounter for closed fracture: Secondary | ICD-10-CM
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
## 2023-05-13 ENCOUNTER — Encounter: Payer: Self-pay | Admitting: Dermatology

## 2023-05-13 ENCOUNTER — Ambulatory Visit (INDEPENDENT_AMBULATORY_CARE_PROVIDER_SITE_OTHER): Payer: 59 | Admitting: Dermatology

## 2023-05-13 VITALS — BP 132/80 | HR 88

## 2023-05-13 DIAGNOSIS — L700 Acne vulgaris: Secondary | ICD-10-CM

## 2023-05-13 DIAGNOSIS — L729 Follicular cyst of the skin and subcutaneous tissue, unspecified: Secondary | ICD-10-CM

## 2023-05-13 DIAGNOSIS — L720 Epidermal cyst: Secondary | ICD-10-CM | POA: Diagnosis not present

## 2023-05-13 DIAGNOSIS — L810 Postinflammatory hyperpigmentation: Secondary | ICD-10-CM

## 2023-05-13 DIAGNOSIS — L723 Sebaceous cyst: Secondary | ICD-10-CM

## 2023-05-13 DIAGNOSIS — L819 Disorder of pigmentation, unspecified: Secondary | ICD-10-CM | POA: Diagnosis not present

## 2023-05-13 MED ORDER — ALTRENO 0.05 % EX LOTN: TOPICAL_LOTION | CUTANEOUS | 5 refills | Status: AC

## 2023-05-13 MED ORDER — TRIAMCINOLONE ACETONIDE 10 MG/ML IJ SUSP: 2.0000 mg | Freq: Once | INTRAMUSCULAR | Status: AC

## 2023-05-13 NOTE — Progress Notes (Signed)
   New Patient Visit   Subjective  Marie Wade is a 33 y.o. female who presents for the following: Acne Vulgaris. Face. Dur: 20 years. Using Adapalene  0.1% twice a day. Has used Adapalene  0.3% and take Doxycycline  in the past. Washes face with Cetaphil normal to oily skin face wash. Uses [place] exfoliating wash 1-2 times a week. Gets cystic bumps at times.  Has made dietary changes and acne has improved some.   The following portions of the chart were reviewed this encounter and updated as appropriate: medications, allergies, medical history  Review of Systems:  No other skin or systemic complaints except as noted in HPI or Assessment and Plan.  Objective  Well appearing patient in no apparent distress; mood and affect are within normal limits.  Areas Examined: Face, chest and back  Relevant exam findings are noted in the Assessment and Plan.          Assessment & Plan    ACNE VULGARIS and Hyperpigmentations  - Assessment: Classic hormonal acne with deeper cystic lesions on the lower third of the face. Cyclical flares occurring primarily before menstruation. Irregular menstrual cycle (every 35 days). Cystic acne worsened after discontinuing Orbactiv during pregnancy. Previous good response to doxycycline . Patient has made dietary modifications, eliminating beef and dairy.  - Plan:    Inject current cyst with dilute Kenalog  to reduce size and reactivity    Prescribe Altreno  (tretinoin ) topical retinoid:     - Start with 2 nights per week, increase to 3 nights if tolerated     - Apply pea-sized amount after hyaluronic acid    Recommend skincare routine:     - AM: Cetaphil Gentle Cleanser, hyaluronic acid, sunscreen     - PM: Cetaphil Gentle Cleanser, hyaluronic acid, Altreno  (on designated nights), moisturizer     - Weekends: Salicylic acid wash, hyaluronic acid, moisturizer    Recommend Isntree Hyaluronic Acid Sunscreen Gel for daily use    Advise mineral sunscreen  (Cetaphil or Neutrogena) for beach days    Consider adding spironolactone if flares persist   Inflammed Cyst  Exam: Inflamed cystic papule at left chin  Treatment: Procedure Note Intralesional Injection  Location: Left side of chin  Informed Consent: Discussed risks (infection, pain, bleeding, bruising, thinning of the skin, loss of skin pigment, lack of resolution, and recurrence of lesion) and benefits of the procedure, as well as the alternatives. Informed consent was obtained. Preparation: The area was prepared a standard fashion.  Anesthesia: none  Procedure Details: An intralesional injection was performed with Kenalog  2 mg/cc. 0.1 cc in total were injected. NDC #: 9604-5409-81 Exp: 05/02/2024  Total number of injections: 1  Plan: The patient was instructed on post-op care. Recommend OTC analgesia as needed for pain.   Intralesional steroid injection side effects were reviewed including thinning of the skin and discoloration, such as redness, lightening or darkening.   Return in about 4 months (around 09/12/2023) for Acne Follow Up.  I, Darcie Easterly, CMA, am acting as scribe for Louana Roup, DO.   Documentation: I have reviewed the above documentation for accuracy and completeness, and I agree with the above.  Louana Roup, DO

## 2023-05-13 NOTE — Patient Instructions (Addendum)
 Hello Shabana,  Thank you for visiting today. Here is a summary of the key instructions:  - Medications: Start using Altreno (tretinoin) for acne:   - Apply a pea-sized amount 2 nights a week   - After one month, increase to Monday, Wednesday, and Friday nights    - Morning Routine:   - Wash face with Cetaphil Gentle Cleanser   - Apply hyaluronic acid   - Apply sunscreen (Isntree Hyaluronic Acid Sunscreen Gel recommended)  - Night Routine:   - Wash face with Cetaphil Gentle Cleanser   - Apply hyaluronic acid   - On Altreno nights, apply Altreno, then moisturize   - On non-Altreno nights, apply moisturizer  - Weekend Routine:   - Use salicylic acid wash instead of Cetaphil   - Apply hyaluronic acid   - Apply moisturizer  - Sunscreen:   - Use Isntree Hyaluronic Acid Sunscreen Gel daily (available on [employer])   - For beach days, use a mineral sunscreen like Cetaphil or Neutrogena  - Additional Instructions:   - Avoid using [place] scrub more than once a week   - Use micellar water for double cleansing if needed   - Drink plenty of water to avoid leg cramps if taking spironolactone  - Follow-up: Return for a follow-up appointment in 4 months  - Contact: Use MyChart for quick responses to any questions or concerns  We look forward to seeing the positive changes in your next visit. If you have any questions or concerns before then, please do not hesitate to contact our office.  Warm regards,  Dr. [facility]     Recommended Sunscreen         Important Information  Due to recent changes in healthcare laws, you may see results of your pathology and/or laboratory studies on MyChart before the doctors have had a chance to review them. We understand that in some cases there may be results that are confusing or concerning to you. Please understand that not all results are received at the same time and often the doctors may need to interpret multiple  results in order to provide you with the best plan of care or course of treatment. Therefore, we ask that you please give [place] 2 business days to thoroughly review all your results before contacting the office for clarification. Should we see a critical lab result, you will be contacted sooner.   If You Need Anything After Your Visit  If you have any questions or concerns for your doctor, please call our main line at [phone] If no one answers, please leave a voicemail as directed and we will return your call as soon as possible. Messages left after 4 pm will be answered the following business day.   You may also send [place] a message via MyChart. We typically respond to MyChart messages within 1-2 business days.  For prescription refills, please ask your pharmacy to contact our office. Our fax number is [phone].  If you have an urgent issue when the clinic is closed that cannot wait until the next business day, you can page your doctor at the number below.    Please note that while we do our best to be available for urgent issues outside of office hours, we are not available 24/7.   If you have an urgent issue and are unable to reach [place], you may choose to seek medical care at your doctor's office, retail clinic, urgent care center, or emergency room.  If  you have a medical emergency, please immediately call 911 or go to the emergency department. In the event of inclement weather, please call our main line at [phone] for an update on the status of any delays or closures.  Dermatology Medication Tips: Please keep the boxes that topical medications come in in order to help keep track of the instructions about where and how to use these. Pharmacies typically print the medication instructions only on the boxes and not directly on the medication tubes.   If your medication is too expensive, please contact our office at [phone] or send [place] a message through MyChart.   We are unable to  tell what your co-pay for medications will be in advance as this is different depending on your insurance coverage. However, we may be able to find a substitute medication at lower cost or fill out paperwork to get insurance to cover a needed medication.   If a prior authorization is required to get your medication covered by your insurance company, please allow [place] 1-2 business days to complete this process.  Drug prices often vary depending on where the prescription is filled and some pharmacies may offer cheaper prices.  The website www.goodrx.com contains coupons for medications through different pharmacies. The prices here do not account for what the cost may be with help from insurance (it may be cheaper with your insurance), but the website can give you the price if you did not use any insurance.  - You can print the associated coupon and take it with your prescription to the pharmacy.  - You may also stop by our office during regular business hours and pick up a GoodRx coupon card.  - If you need your prescription sent electronically to a different pharmacy, notify our office through [facility] or by phone at [phone]

## 2023-05-23 ENCOUNTER — Encounter: Payer: Self-pay | Admitting: Dermatology

## 2023-09-15 ENCOUNTER — Ambulatory Visit: Admitting: Dermatology

## 2023-11-30 ENCOUNTER — Ambulatory Visit (INDEPENDENT_AMBULATORY_CARE_PROVIDER_SITE_OTHER)

## 2023-11-30 DIAGNOSIS — Z23 Encounter for immunization: Secondary | ICD-10-CM
